# Patient Record
Sex: Male | Born: 1950
Health system: Southern US, Community
[De-identification: ages and names within clinical notes are randomized; demographics above are authoritative.]

## PROBLEM LIST (undated history)

## (undated) ENCOUNTER — Emergency Department (HOSPITAL_COMMUNITY): Admission: EM | Payer: Medicare Other | Source: Home / Self Care

## (undated) DIAGNOSIS — R443 Hallucinations, unspecified: Secondary | ICD-10-CM

## (undated) DIAGNOSIS — F0390 Unspecified dementia without behavioral disturbance: Secondary | ICD-10-CM

---

## 2003-06-13 ENCOUNTER — Emergency Department (HOSPITAL_COMMUNITY): Admission: EM | Admit: 2003-06-13 | Discharge: 2003-06-13 | Payer: Self-pay | Admitting: Emergency Medicine

## 2014-04-18 DIAGNOSIS — R634 Abnormal weight loss: Secondary | ICD-10-CM | POA: Diagnosis not present

## 2014-04-18 DIAGNOSIS — Z125 Encounter for screening for malignant neoplasm of prostate: Secondary | ICD-10-CM | POA: Diagnosis not present

## 2014-04-18 DIAGNOSIS — M549 Dorsalgia, unspecified: Secondary | ICD-10-CM | POA: Diagnosis not present

## 2014-04-18 DIAGNOSIS — R109 Unspecified abdominal pain: Secondary | ICD-10-CM | POA: Diagnosis not present

## 2014-04-18 DIAGNOSIS — R0602 Shortness of breath: Secondary | ICD-10-CM | POA: Diagnosis not present

## 2014-04-29 ENCOUNTER — Other Ambulatory Visit: Payer: Self-pay | Admitting: Internal Medicine

## 2014-04-29 DIAGNOSIS — R1013 Epigastric pain: Secondary | ICD-10-CM

## 2014-04-29 DIAGNOSIS — R0602 Shortness of breath: Secondary | ICD-10-CM | POA: Diagnosis not present

## 2014-04-29 DIAGNOSIS — R634 Abnormal weight loss: Secondary | ICD-10-CM

## 2014-05-03 ENCOUNTER — Ambulatory Visit
Admission: RE | Admit: 2014-05-03 | Discharge: 2014-05-03 | Disposition: A | Discharge status: Home / Self Care | Payer: Medicare Other | Source: Ambulatory Visit | Attending: Internal Medicine | Admitting: Internal Medicine

## 2014-05-03 DIAGNOSIS — R1013 Epigastric pain: Secondary | ICD-10-CM

## 2014-05-03 DIAGNOSIS — R634 Abnormal weight loss: Secondary | ICD-10-CM | POA: Diagnosis not present

## 2014-05-03 DIAGNOSIS — N281 Cyst of kidney, acquired: Secondary | ICD-10-CM | POA: Diagnosis not present

## 2014-05-03 DIAGNOSIS — K921 Melena: Secondary | ICD-10-CM | POA: Diagnosis not present

## 2014-05-03 MED ORDER — IOHEXOL 300 MG/ML  SOLN
125.0000 mL | Freq: Once | INTRAMUSCULAR | Status: AC | PRN
  Administered 2014-05-03: 125 mL via INTRAVENOUS

## 2014-05-06 DIAGNOSIS — R109 Unspecified abdominal pain: Secondary | ICD-10-CM | POA: Diagnosis not present

## 2014-05-06 DIAGNOSIS — R634 Abnormal weight loss: Secondary | ICD-10-CM | POA: Diagnosis not present

## 2014-06-01 DIAGNOSIS — R1031 Right lower quadrant pain: Secondary | ICD-10-CM | POA: Diagnosis not present

## 2014-06-01 DIAGNOSIS — R1032 Left lower quadrant pain: Secondary | ICD-10-CM | POA: Diagnosis not present

## 2014-06-01 DIAGNOSIS — R634 Abnormal weight loss: Secondary | ICD-10-CM | POA: Diagnosis not present

## 2014-06-01 DIAGNOSIS — Z1211 Encounter for screening for malignant neoplasm of colon: Secondary | ICD-10-CM | POA: Diagnosis not present

## 2014-06-14 DIAGNOSIS — R1032 Left lower quadrant pain: Secondary | ICD-10-CM | POA: Diagnosis not present

## 2014-06-14 DIAGNOSIS — Z1211 Encounter for screening for malignant neoplasm of colon: Secondary | ICD-10-CM | POA: Diagnosis not present

## 2014-06-14 DIAGNOSIS — R1031 Right lower quadrant pain: Secondary | ICD-10-CM | POA: Diagnosis not present

## 2014-06-14 DIAGNOSIS — K5730 Diverticulosis of large intestine without perforation or abscess without bleeding: Secondary | ICD-10-CM | POA: Diagnosis not present

## 2014-08-05 DIAGNOSIS — M150 Primary generalized (osteo)arthritis: Secondary | ICD-10-CM | POA: Diagnosis not present

## 2014-08-05 DIAGNOSIS — I10 Essential (primary) hypertension: Secondary | ICD-10-CM | POA: Diagnosis not present

## 2014-08-05 DIAGNOSIS — E6609 Other obesity due to excess calories: Secondary | ICD-10-CM | POA: Diagnosis not present

## 2014-10-10 DIAGNOSIS — I10 Essential (primary) hypertension: Secondary | ICD-10-CM | POA: Diagnosis not present

## 2014-10-13 DIAGNOSIS — I10 Essential (primary) hypertension: Secondary | ICD-10-CM | POA: Diagnosis not present

## 2017-10-20 DIAGNOSIS — M5136 Other intervertebral disc degeneration, lumbar region: Secondary | ICD-10-CM | POA: Diagnosis not present

## 2017-10-20 DIAGNOSIS — M1711 Unilateral primary osteoarthritis, right knee: Secondary | ICD-10-CM | POA: Diagnosis not present

## 2017-10-20 DIAGNOSIS — M25561 Pain in right knee: Secondary | ICD-10-CM | POA: Diagnosis not present

## 2017-10-20 DIAGNOSIS — M545 Low back pain: Secondary | ICD-10-CM | POA: Diagnosis not present

## 2017-10-20 DIAGNOSIS — M25562 Pain in left knee: Secondary | ICD-10-CM | POA: Diagnosis not present

## 2017-10-20 DIAGNOSIS — I10 Essential (primary) hypertension: Secondary | ICD-10-CM | POA: Diagnosis not present

## 2017-11-12 DIAGNOSIS — I10 Essential (primary) hypertension: Secondary | ICD-10-CM | POA: Diagnosis not present

## 2017-11-12 DIAGNOSIS — E6609 Other obesity due to excess calories: Secondary | ICD-10-CM | POA: Diagnosis not present

## 2017-11-12 DIAGNOSIS — Z0000 Encounter for general adult medical examination without abnormal findings: Secondary | ICD-10-CM | POA: Diagnosis not present

## 2017-11-12 DIAGNOSIS — Z131 Encounter for screening for diabetes mellitus: Secondary | ICD-10-CM | POA: Diagnosis not present

## 2017-11-17 DIAGNOSIS — I10 Essential (primary) hypertension: Secondary | ICD-10-CM | POA: Diagnosis not present

## 2017-11-17 DIAGNOSIS — M25561 Pain in right knee: Secondary | ICD-10-CM | POA: Diagnosis not present

## 2017-11-19 DIAGNOSIS — M1711 Unilateral primary osteoarthritis, right knee: Secondary | ICD-10-CM | POA: Diagnosis not present

## 2017-11-19 DIAGNOSIS — M1712 Unilateral primary osteoarthritis, left knee: Secondary | ICD-10-CM | POA: Diagnosis not present

## 2018-12-30 DIAGNOSIS — M549 Dorsalgia, unspecified: Secondary | ICD-10-CM | POA: Diagnosis not present

## 2018-12-30 DIAGNOSIS — Z125 Encounter for screening for malignant neoplasm of prostate: Secondary | ICD-10-CM | POA: Diagnosis not present

## 2018-12-30 DIAGNOSIS — K5900 Constipation, unspecified: Secondary | ICD-10-CM | POA: Diagnosis not present

## 2018-12-30 DIAGNOSIS — E6609 Other obesity due to excess calories: Secondary | ICD-10-CM | POA: Diagnosis not present

## 2018-12-30 DIAGNOSIS — I10 Essential (primary) hypertension: Secondary | ICD-10-CM | POA: Diagnosis not present

## 2019-02-18 ENCOUNTER — Other Ambulatory Visit: Payer: Self-pay

## 2019-05-13 DIAGNOSIS — Z23 Encounter for immunization: Secondary | ICD-10-CM | POA: Diagnosis not present

## 2019-05-13 DIAGNOSIS — Z7189 Other specified counseling: Secondary | ICD-10-CM | POA: Diagnosis not present

## 2019-05-13 DIAGNOSIS — F0390 Unspecified dementia without behavioral disturbance: Secondary | ICD-10-CM | POA: Diagnosis not present

## 2019-05-13 DIAGNOSIS — Z125 Encounter for screening for malignant neoplasm of prostate: Secondary | ICD-10-CM | POA: Diagnosis not present

## 2019-05-13 DIAGNOSIS — Z0000 Encounter for general adult medical examination without abnormal findings: Secondary | ICD-10-CM | POA: Diagnosis not present

## 2019-05-13 DIAGNOSIS — I10 Essential (primary) hypertension: Secondary | ICD-10-CM | POA: Diagnosis not present

## 2019-05-17 DIAGNOSIS — M25561 Pain in right knee: Secondary | ICD-10-CM | POA: Diagnosis not present

## 2019-05-17 DIAGNOSIS — F0390 Unspecified dementia without behavioral disturbance: Secondary | ICD-10-CM | POA: Diagnosis not present

## 2019-05-17 DIAGNOSIS — R972 Elevated prostate specific antigen [PSA]: Secondary | ICD-10-CM | POA: Diagnosis not present

## 2019-05-17 DIAGNOSIS — M545 Low back pain: Secondary | ICD-10-CM | POA: Diagnosis not present

## 2019-05-17 DIAGNOSIS — Z7189 Other specified counseling: Secondary | ICD-10-CM | POA: Diagnosis not present

## 2019-05-17 DIAGNOSIS — E6609 Other obesity due to excess calories: Secondary | ICD-10-CM | POA: Diagnosis not present

## 2019-05-25 ENCOUNTER — Other Ambulatory Visit: Payer: Self-pay | Admitting: Internal Medicine

## 2019-05-25 DIAGNOSIS — M25561 Pain in right knee: Secondary | ICD-10-CM

## 2019-06-01 ENCOUNTER — Ambulatory Visit
Admission: RE | Admit: 2019-06-01 | Discharge: 2019-06-01 | Disposition: A | Discharge status: Home / Self Care | Payer: Medicare Other | Source: Ambulatory Visit | Attending: Internal Medicine | Admitting: Internal Medicine

## 2019-06-01 DIAGNOSIS — M25561 Pain in right knee: Secondary | ICD-10-CM

## 2019-06-01 DIAGNOSIS — R600 Localized edema: Secondary | ICD-10-CM | POA: Diagnosis not present

## 2019-06-01 DIAGNOSIS — M1711 Unilateral primary osteoarthritis, right knee: Secondary | ICD-10-CM | POA: Diagnosis not present

## 2019-06-03 DIAGNOSIS — R972 Elevated prostate specific antigen [PSA]: Secondary | ICD-10-CM | POA: Diagnosis not present

## 2019-06-09 DIAGNOSIS — I10 Essential (primary) hypertension: Secondary | ICD-10-CM | POA: Diagnosis not present

## 2019-06-09 DIAGNOSIS — F0390 Unspecified dementia without behavioral disturbance: Secondary | ICD-10-CM | POA: Diagnosis not present

## 2019-06-09 DIAGNOSIS — R972 Elevated prostate specific antigen [PSA]: Secondary | ICD-10-CM | POA: Diagnosis not present

## 2019-06-09 DIAGNOSIS — M25561 Pain in right knee: Secondary | ICD-10-CM | POA: Diagnosis not present

## 2019-06-16 ENCOUNTER — Other Ambulatory Visit: Payer: Self-pay

## 2019-07-29 DIAGNOSIS — R972 Elevated prostate specific antigen [PSA]: Secondary | ICD-10-CM | POA: Diagnosis not present

## 2020-05-17 DIAGNOSIS — Z23 Encounter for immunization: Secondary | ICD-10-CM | POA: Diagnosis not present

## 2020-05-17 DIAGNOSIS — F0390 Unspecified dementia without behavioral disturbance: Secondary | ICD-10-CM | POA: Diagnosis not present

## 2020-05-17 DIAGNOSIS — M25561 Pain in right knee: Secondary | ICD-10-CM | POA: Diagnosis not present

## 2020-05-17 DIAGNOSIS — R972 Elevated prostate specific antigen [PSA]: Secondary | ICD-10-CM | POA: Diagnosis not present

## 2020-05-17 DIAGNOSIS — Z0000 Encounter for general adult medical examination without abnormal findings: Secondary | ICD-10-CM | POA: Diagnosis not present

## 2020-05-17 DIAGNOSIS — I10 Essential (primary) hypertension: Secondary | ICD-10-CM | POA: Diagnosis not present

## 2020-05-24 DIAGNOSIS — R972 Elevated prostate specific antigen [PSA]: Secondary | ICD-10-CM | POA: Diagnosis not present

## 2020-05-24 DIAGNOSIS — R634 Abnormal weight loss: Secondary | ICD-10-CM | POA: Diagnosis not present

## 2020-05-24 DIAGNOSIS — I10 Essential (primary) hypertension: Secondary | ICD-10-CM | POA: Diagnosis not present

## 2020-05-24 DIAGNOSIS — F0390 Unspecified dementia without behavioral disturbance: Secondary | ICD-10-CM | POA: Diagnosis not present

## 2020-05-24 DIAGNOSIS — E6609 Other obesity due to excess calories: Secondary | ICD-10-CM | POA: Diagnosis not present

## 2021-05-04 DIAGNOSIS — I10 Essential (primary) hypertension: Secondary | ICD-10-CM | POA: Diagnosis not present

## 2021-05-11 DIAGNOSIS — R443 Hallucinations, unspecified: Secondary | ICD-10-CM | POA: Diagnosis not present

## 2021-05-11 DIAGNOSIS — F03C2 Unspecified dementia, severe, with psychotic disturbance: Secondary | ICD-10-CM | POA: Diagnosis not present

## 2021-05-11 DIAGNOSIS — I10 Essential (primary) hypertension: Secondary | ICD-10-CM | POA: Diagnosis not present

## 2021-05-11 DIAGNOSIS — F0390 Unspecified dementia without behavioral disturbance: Secondary | ICD-10-CM | POA: Diagnosis not present

## 2021-05-12 ENCOUNTER — Other Ambulatory Visit: Payer: Self-pay

## 2021-05-12 ENCOUNTER — Emergency Department (HOSPITAL_COMMUNITY)
Admission: EM | Admit: 2021-05-12 | Discharge: 2021-05-15 | Disposition: A | Discharge status: Home / Self Care | Payer: Medicare Other | Attending: Emergency Medicine | Admitting: Emergency Medicine

## 2021-05-12 ENCOUNTER — Emergency Department (HOSPITAL_COMMUNITY): Payer: Medicare Other

## 2021-05-12 ENCOUNTER — Encounter (HOSPITAL_COMMUNITY): Payer: Self-pay | Admitting: Emergency Medicine

## 2021-05-12 DIAGNOSIS — D649 Anemia, unspecified: Secondary | ICD-10-CM | POA: Diagnosis not present

## 2021-05-12 DIAGNOSIS — R451 Restlessness and agitation: Secondary | ICD-10-CM | POA: Diagnosis not present

## 2021-05-12 DIAGNOSIS — R443 Hallucinations, unspecified: Secondary | ICD-10-CM | POA: Diagnosis not present

## 2021-05-12 DIAGNOSIS — F03911 Unspecified dementia, unspecified severity, with agitation: Secondary | ICD-10-CM | POA: Insufficient documentation

## 2021-05-12 DIAGNOSIS — U071 COVID-19: Secondary | ICD-10-CM | POA: Diagnosis not present

## 2021-05-12 DIAGNOSIS — I517 Cardiomegaly: Secondary | ICD-10-CM | POA: Diagnosis not present

## 2021-05-12 DIAGNOSIS — Z79899 Other long term (current) drug therapy: Secondary | ICD-10-CM | POA: Insufficient documentation

## 2021-05-12 DIAGNOSIS — F03C11 Unspecified dementia, severe, with agitation: Secondary | ICD-10-CM | POA: Insufficient documentation

## 2021-05-12 DIAGNOSIS — R4182 Altered mental status, unspecified: Secondary | ICD-10-CM | POA: Diagnosis not present

## 2021-05-12 DIAGNOSIS — D696 Thrombocytopenia, unspecified: Secondary | ICD-10-CM | POA: Diagnosis not present

## 2021-05-12 DIAGNOSIS — D72819 Decreased white blood cell count, unspecified: Secondary | ICD-10-CM | POA: Insufficient documentation

## 2021-05-12 DIAGNOSIS — R9431 Abnormal electrocardiogram [ECG] [EKG]: Secondary | ICD-10-CM | POA: Diagnosis not present

## 2021-05-12 LAB — URINALYSIS, ROUTINE W REFLEX MICROSCOPIC
Bacteria, UA: NONE SEEN
Bilirubin Urine: NEGATIVE
Glucose, UA: NEGATIVE mg/dL
Ketones, ur: 5 mg/dL — AB
Leukocytes,Ua: NEGATIVE
Nitrite: NEGATIVE
Protein, ur: 30 mg/dL — AB
Specific Gravity, Urine: 1.025 (ref 1.005–1.030)
pH: 5 (ref 5.0–8.0)

## 2021-05-12 LAB — COMPREHENSIVE METABOLIC PANEL
ALT: 19 U/L (ref 0–44)
AST: 31 U/L (ref 15–41)
Albumin: 4.5 g/dL (ref 3.5–5.0)
Alkaline Phosphatase: 32 U/L — ABNORMAL LOW (ref 38–126)
Anion gap: 8 (ref 5–15)
BUN: 13 mg/dL (ref 8–23)
CO2: 25 mmol/L (ref 22–32)
Calcium: 8.7 mg/dL — ABNORMAL LOW (ref 8.9–10.3)
Chloride: 103 mmol/L (ref 98–111)
Creatinine, Ser: 1.19 mg/dL (ref 0.61–1.24)
GFR, Estimated: >60 mL/min (ref >60)
Glucose, Bld: 99 mg/dL (ref 70–99)
Potassium: 3.8 mmol/L (ref 3.5–5.1)
Sodium: 136 mmol/L (ref 135–145)
Total Bilirubin: 1 mg/dL (ref 0.3–1.2)
Total Protein: 7.6 g/dL (ref 6.5–8.1)

## 2021-05-12 LAB — CBC WITH DIFFERENTIAL/PLATELET
Abs Immature Granulocytes: 0.01 10*3/uL (ref 0.00–0.07)
Basophils Absolute: 0 10*3/uL (ref 0.0–0.1)
Basophils Relative: 1 %
Eosinophils Absolute: 0 10*3/uL (ref 0.0–0.5)
Eosinophils Relative: 1 %
HCT: 32.7 % — ABNORMAL LOW (ref 39.0–52.0)
Hemoglobin: 11.3 g/dL — ABNORMAL LOW (ref 13.0–17.0)
Immature Granulocytes: 1 %
Lymphocytes Relative: 34 %
Lymphs Abs: 0.6 10*3/uL — ABNORMAL LOW (ref 0.7–4.0)
MCH: 34.1 pg — ABNORMAL HIGH (ref 26.0–34.0)
MCHC: 34.6 g/dL (ref 30.0–36.0)
MCV: 98.8 fL (ref 80.0–100.0)
Monocytes Absolute: 0.3 10*3/uL (ref 0.1–1.0)
Monocytes Relative: 16 %
Neutro Abs: 0.9 10*3/uL — ABNORMAL LOW (ref 1.7–7.7)
Neutrophils Relative %: 47 %
Platelets: 137 10*3/uL — ABNORMAL LOW (ref 150–400)
RBC: 3.31 MIL/uL — ABNORMAL LOW (ref 4.22–5.81)
RDW: 12 % (ref 11.5–15.5)
WBC: 1.8 10*3/uL — ABNORMAL LOW (ref 4.0–10.5)
nRBC: 0 % (ref 0.0–0.2)

## 2021-05-12 LAB — SALICYLATE LEVEL: Salicylate Lvl: <7 mg/dL — ABNORMAL LOW (ref 7.0–30.0)

## 2021-05-12 LAB — CARBAMAZEPINE LEVEL, TOTAL: Carbamazepine Lvl: 5.2 ug/mL (ref 4.0–12.0)

## 2021-05-12 LAB — ETHANOL: Alcohol, Ethyl (B): <10 mg/dL (ref <10)

## 2021-05-12 LAB — RESP PANEL BY RT-PCR (FLU A&B, COVID) ARPGX2
Influenza A by PCR: NEGATIVE
Influenza B by PCR: NEGATIVE
SARS Coronavirus 2 by RT PCR: POSITIVE — AB

## 2021-05-12 LAB — ACETAMINOPHEN LEVEL: Acetaminophen (Tylenol), Serum: <10 ug/mL — ABNORMAL LOW (ref 10–30)

## 2021-05-12 MED ORDER — MEMANTINE HCL 5 MG PO TABS
10.0000 mg | ORAL_TABLET | Freq: Two times a day (BID) | ORAL | Status: DC
  Administered 2021-05-14 – 2021-05-15 (×3): 10 mg via ORAL
  Filled 2021-05-12 (×3): qty 2

## 2021-05-12 MED ORDER — LORAZEPAM 2 MG/ML IJ SOLN
1.0000 mg | Freq: Once | INTRAMUSCULAR | Status: AC
  Administered 2021-05-12: 1 mg via INTRAMUSCULAR
  Filled 2021-05-12: qty 1

## 2021-05-12 MED ORDER — LORAZEPAM 1 MG PO TABS
2.0000 mg | ORAL_TABLET | Freq: Once | ORAL | Status: AC
  Administered 2021-05-12: 2 mg via ORAL
  Filled 2021-05-12: qty 2

## 2021-05-12 NOTE — ED Triage Notes (Signed)
Pt arrived via PD escort, pr pt SO, pt early stages of dementia. Has been hallucinating, seeing people in home, chasing them with small bat.

## 2021-05-12 NOTE — ED Provider Notes (Addendum)
Walter [name]   CSN: [id number] Arrival date & time: 05/12/21  0945     History Chief Complaint  Patient presents with   Hallucinations    Walter [name] is a 70 y.o. male with past medical history significant for recent worsening dementia who was started on carbamazepine as well as lorazepam recently at his last appointment to assess his worsening dementia who presents accompanied by wife and daughter due to 1 night of worsening confusion, insomnia, dangerous behavior.  Family reports that patient was up all night, in the kitchen, turning water on, running outside, walking around the house with a baseball bat, sharp screwdriver and intent to "keep the house safe from intruders".  Patient has not had any recent falls or injuries.  Family believes that these problems have worsened since starting his new medications, prior to beginning the carbamazepine and lorazepam patient's primary symptom was some sundowning.  Family also reports that he has had some significant hallucinations including thinking that people are breaking into the house, hearing voices that are not there.  Patient is on memantine at baseline, dose unchanged at last visit.  Patient's wife reports that he did take his dose of memantine this morning, but usually takes it twice daily, patient has not had any Ativan or carbamazepine today.  Level 5 caveat: dementia  HPI     History reviewed. No pertinent past medical history.  There are no problems to display for this patient.   History reviewed. No pertinent surgical history.     History reviewed. No pertinent family history.     Home Medications Prior to Admission medications   Medication Sig Start Date End Date Taking? Authorizing Provider  amLODipine (NORVASC) 5 MG tablet Take 5 mg by mouth daily. 11/17/17  Yes [provider]  carbamazepine (TEGRETOL) 200 MG tablet Take 200 mg by mouth in the morning and  at bedtime. 05/11/21  Yes [provider]  irbesartan-hydrochlorothiazide (AVALIDE) 150-12.5 MG tablet Take 1 tablet by mouth daily. 05/24/20  Yes [provider]  LORazepam (ATIVAN) 1 MG tablet Take 1 mg by mouth in the morning and at bedtime. 05/11/21  Yes [provider]  memantine (NAMENDA) 10 MG tablet Take 10 mg by mouth in the morning and at bedtime.   Yes [provider]    Allergies    Patient has no known allergies.  Review of Systems   Review of Systems  Psychiatric/Behavioral:  Positive for agitation, confusion and sleep disturbance. The patient is nervous/anxious.   All other systems reviewed and are negative.  Physical Exam Updated Vital Signs BP (!) 152/90   Pulse (!) 53   Temp 98.4 F (36.9 C) (Oral)   Resp 18   SpO2 100%   Physical Exam Vitals and nursing [name] reviewed.  Constitutional:      General: He is not in acute distress.    Appearance: Normal appearance.  HENT:     Head: Normocephalic and atraumatic.  Eyes:     General:        Right eye: No discharge.        Left eye: No discharge.  Cardiovascular:     Rate and Rhythm: Normal rate and regular rhythm.     Heart sounds: No murmur heard.   No friction rub. No gallop.  Pulmonary:     Effort: Pulmonary effort is normal.     Breath sounds: Normal breath sounds.  Abdominal:     General: Bowel  sounds are normal.     Palpations: Abdomen is soft.  Skin:    General: Skin is warm and dry.     Capillary Refill: Capillary refill takes less than 2 seconds.  Neurological:     Mental Status: He is alert.     Comments: Oriented only to self.  Cranial nerves III through XII grossly intact.  Romberg negative, gait normal.  Thought content is confused, difficult to keep patient on 1 track.  Illogical thought content.  Psychiatric:     Comments: Patient denies suicidal ideations, homicidal ideations, however he does endorse that he has been seeing or hearing people that his wife  is not seeing, occasionally hears people speaking that his wife does not hear.    ED Results / Procedures / Treatments   Labs (all labs ordered are listed, but only abnormal results are displayed) Labs Reviewed  URINALYSIS, ROUTINE W REFLEX MICROSCOPIC - Abnormal; Notable for the following components:      Result Value   Hgb urine dipstick SMALL (*)    Ketones, ur 5 (*)    Protein, ur 30 (*)    All other components within normal limits  COMPREHENSIVE METABOLIC PANEL - Abnormal; Notable for the following components:   Calcium 8.7 (*)    Alkaline Phosphatase 32 (*)    All other components within normal limits  CBC WITH DIFFERENTIAL/PLATELET - Abnormal; Notable for the following components:   WBC 1.8 (*)    RBC 3.31 (*)    Hemoglobin 11.3 (*)    HCT 32.7 (*)    MCH 34.1 (*)    Platelets 137 (*)    Neutro Abs 0.9 (*)    Lymphs Abs 0.6 (*)    All other components within normal limits  SALICYLATE LEVEL - Abnormal; Notable for the following components:   Salicylate Lvl <7.0 (*)    All other components within normal limits  ACETAMINOPHEN LEVEL - Abnormal; Notable for the following components:   Acetaminophen (Tylenol), Serum <10 (*)    All other components within normal limits  RESP PANEL BY RT-PCR (FLU A&B, COVID) ARPGX2  ETHANOL  CARBAMAZEPINE LEVEL, TOTAL    EKG EKG Interpretation  Date/Time:  Saturday May 12 2021 10:50:34 EDT Ventricular Rate:  62 PR Interval:  150 QRS Duration: 90 QT Interval:  400 QTC Calculation: 407 R Axis:   37 Text Interpretation: Sinus rhythm No old tracing to compare Confirmed by Mancel Bale [id number] on 05/12/2021 11:57:01 AM  Radiology CT Head Wo Contrast  Result Date: 05/12/2021 CLINICAL DATA:  Mental status change.  Hallucinations. EXAM: CT HEAD WITHOUT CONTRAST TECHNIQUE: Contiguous axial images were obtained from the base of the skull through the vertex without intravenous contrast. COMPARISON:  None. FINDINGS: Brain: No evidence  of acute infarction, hemorrhage, hydrocephalus, extra-axial collection or mass lesion/mass effect. There is mild diffuse low-attenuation within the subcortical and periventricular white matter compatible with chronic microvascular disease. Vascular: No hyperdense vessel or unexpected calcification. Skull: Normal. Negative for fracture or focal lesion. Sinuses/Orbits: Chronic periosteal thickening involving the right maxillary sinus noted. There is mild mucosal thickening involving the left maxillary sinus. Other: None IMPRESSION: 1. No acute intracranial abnormalities. 2. Chronic small vessel ischemic change. Electronically Signed   By: Signa Kell M.D.   On: 05/12/2021 10:49    Procedures Procedures   Medications Ordered in ED Medications  LORazepam (ATIVAN) tablet 2 mg (has no administration in time range)    ED Course  I have reviewed the triage vital  signs and the nursing notes.  Pertinent labs & imaging results that were available during my care of the patient were reviewed by me and considered in my medical decision making (see chart for details).    MDM Rules/Calculators/A&P                         I discussed this case with my attending physician who cosigned this [name] including patient's presenting symptoms, physical exam, and planned diagnostics and interventions. Attending physician stated agreement with plan or made changes to plan which were implemented.   Attending physician assessed patient at bedside.  Patient is alert and oriented only to himself during my examination, however has no other neurologic deficits.  CT head does not reveal any other intracranial abnormalities.  Lab work reveals mild anemia, mild leukopenia, mild thrombocytopenia.  Without any other evidence of the symptoms, or reason to suspect bone marrow crisis or aplastic anemia, believe this may be a side effect of patient's memantine medication.  Patient's symptoms worsen, now include aggressive behaviors,  with both his wife, and his daughter worried for their own and the patient safety, we will consult psych at this time in order to further assess patient's symptoms, consideration for admission at this time.  Patient denies SI, HI, does endorse audiovisual hallucinations at this time.  Physical exam otherwise unremarkable, carbamazepine level pending at this time no other signs of toxic ingestion. Patient is medically cleared at this time.  Based on new aggressive uncontrolled behaviors with dementia will consult TTS for geriatric psychiatry consult at this time.  Patient is due for memantine later tonight, may receive Ativan as needed for agitation.  3:12 PM Patient wanted to leave, wife still insistent that patient needs to be evaluated and patient given ativan at this time. Patient to be IVCd due to danger to others if he attempts to leave. Final Clinical Impression(s) / ED Diagnoses Final diagnoses:  Hallucinations  Severe dementia with agitation, unspecified dementia type    Rx / DC Orders ED Discharge Orders     None        Olene Floss, PA-C 05/12/21 1513    Olene Floss, PA-C 05/12/21 1531    Mancel Bale, MD 05/13/21 [phone]

## 2021-05-12 NOTE — ED Provider Notes (Signed)
  Face-to-face evaluation   History: He presents at the insistence of family members for evaluation of suspected dementia.  Saw his PCP yesterday who ordered Ativan carbamazepine.  At home he has been hallucinating, carrying a baseball bat in a threatening manner.  He is here with his wife who states that he became acutely worse last night but has been calm progressively worse for about 2 weeks.  He has been on memantine, for memory loss, for 6 months.  Physical exam: Elderly, alert and cooperative.  No acute distress noted.  He is unable to give history.  He is pleasant and follows directions.  His appearance is nontoxic.  Medical screening examination/treatment/procedure(s) were conducted as a shared visit with non-physician practitioner(s) and myself.  I personally evaluated the patient during the encounter    Mancel Bale, MD 05/13/21 [phone]

## 2021-05-12 NOTE — ED Notes (Signed)
Pt attempting to leave and pt pulled code blue button off the wall. Pt getting harder to redirect back to bed. Pt under IVC at this time. PA Logan made aware.

## 2021-05-12 NOTE — ED Provider Notes (Addendum)
PossiblePatient's COVID test is positive.  It may explain some of his worsening dementia.  Will reassess to see if there is an indication for medical admission.  We will based on his labs on his white blood cell count being 1.8 and absolute neutrophil count of 0.9.  We will discussed with hospitalist about this.  But patient certainly had pre-existing dementia.  And could be that the COVID illness is just making things worse.  Urine negative.  Head CT without any acute findings.  Will get chest x-ray since he is COVID-positive.   Fredia Sorrow, MD 05/12/21 2026  Patient's chest x-ray without any acute findings.  Discussed the low white blood cell count and the absolute neutrophil count with the on-call hours.  Did not feel that he met any criteria for medical admission regarding but did state that it would be worthwhile to follow them carefully.  Patient's not febrile.  Is not hypoxic.  Chest x-ray is clear.  They also did not feel he needed any kind of COVID intervention medications at this time.  Know there is a delay with admission when they are COVID-positive through the geriatric process.  But we will continue to follow him closely medically.  But at this point in time not meeting indications for medical admission.    Fredia Sorrow, MD 05/12/21 2107

## 2021-05-12 NOTE — BH Assessment (Signed)
Comprehensive Clinical Assessment (CCA) Note  05/12/2021 Walter [name] [id number]  DISPOSITION: Leevy-Johnson NP recommends a inpatient admission (Geropsychiatry) to assist with stabilization as bed placement is investigated.   Flowsheet Row ED from 05/12/2021 in [facility]ERGENCY DEPT  C-SSRS RISK CATEGORY No Risk        Flowsheet Row ED from 05/12/2021 in [facility]GENCY DEPT  C-SSRS RISK CATEGORY No Risk      The patient demonstrates the following risk factors for suicide: Chronic risk factors for suicide include: N/A. Acute risk factors for suicide include: N/A. Protective factors for this patient include: coping skills. Considering these factors, the overall suicide risk at this point appears to be low. Patient is not appropriate for outpatient follow up.   Patient is a 70 year old male that presents with AMS after patient per wife Walter [name] [phone]) reports that patient has had increased hallucinations over the last week with VH growing increasingly worse over the last 48 hours. Patient's wife reports patient in the last 24 hours has been violent "trying to attack people that are not there." Wife reports that patient was diagnosed with dementia in June of 2021. Wife states patient has never been admitted inpatient for any memory care issues although has been on medications from his PCP since then. Wife reports current compliance and recent medication changes within the last week with patient being prescribed Ativan and Tegretol. See MAR. Patient denies any S/I, H/I or AVH although this writer is uncertain if patient is processing the content of this writer's questions. Patient just keeps repeating "no, no, no" when this writer asks any assessment questions. Patient is not oriented and is very disorganized. Patient is observed to be urinating on himself and looking under the bed for "dead bodies." Wife reports patient recently took a  screwdriver (all sharps have been removed from home) and was trying to "attack the intruders in their home." Wife reports patient has never attempted self harm or tried to harm others on purpose. Wife is requesting a inpatient admission to assist with stabilization.            Prosperi PA writes earlier this date: Walter [name] is a 70 y.o. male with past medical history significant for recent worsening dementia who was started on carbamazepine as well as lorazepam recently at his last appointment to assess his worsening dementia who presents accompanied by wife and daughter due to 1 night of worsening confusion, insomnia, dangerous behavior.  Family reports that patient was up all night, in the kitchen, turning water on, running outside, walking around the house with a baseball bat, sharp screwdriver and intent to "keep the house safe from intruders".  Patient has not had any recent falls or injuries.  Family believes that these problems have worsened since starting his new medications, prior to beginning the carbamazepine and lorazepam patient's primary symptom was some sundowning.  Family also reports that he has had some significant hallucinations including thinking that people are breaking into the house, hearing voices that are not there.  Patient is on memantine at baseline, dose unchanged at last visit.  Patient's wife reports that he did take his dose of memantine this morning, but usually takes it twice daily, patient has not had any Ativan or carbamazepine today.  Patient is not oriented. Patient is very disorganized with memory impaired and thoughts disorganized. Patient's mood is anxious with affect congruent. Patient appears to be responding to internal stimuli as evidenced by patient "  looking under his bed for dead people."       Chief Complaint:  Chief Complaint  Patient presents with   Hallucinations   Visit Diagnosis: Dementia     CCA Screening, Triage and Referral  (STR)  Patient Reported Information How did you hear about [place]? Family/Friend  What Is the Reason for Your Visit/Call Today? Ongoing memory care issues  How Long Has This Been Causing You Problems? 1-6 months  What Do You Feel Would Help You the Most Today? -- (UTA)   Have You Recently Had Any Thoughts About Hurting Yourself? No  Are You Planning to Commit Suicide/Harm Yourself At This time? No   Have you Recently Had Thoughts About Hurting Someone Walter [name]? No  Are You Planning to Harm Someone at This Time? No  Explanation: No data recorded  Have You Used Any Alcohol or Drugs in the Past 24 Hours? No  How Long Ago Did You Use Drugs or Alcohol? No data recorded What Did You Use and How Much? No data recorded  Do You Currently Have a Therapist/Psychiatrist? No  Name of Therapist/Psychiatrist: No data recorded  Have You Been Recently Discharged From Any Office Practice or Programs? No  Explanation of Discharge From Practice/Program: No data recorded    CCA Screening Triage Referral Assessment Type of Contact: Face-to-Face  Telemedicine Service Delivery:   Is this Initial or Reassessment? No data recorded Date Telepsych consult ordered in [facility]:  No data recorded Time Telepsych consult ordered in [facility]:  No data recorded Location of Assessment: [facility] ED  Provider Location: [facility]   Collateral Involvement: Wife who is present at bedside   Does Patient Have a Court Appointed Legal Guardian? No data recorded Name and Contact of Legal Guardian: No data recorded If Minor and Not Living with Parent(s), Who has Custody? NA  Is CPS involved or ever been involved? Never  Is APS involved or ever been involved? Never   Patient Determined To Be At Risk for Harm To Self or Others Based on Review of Patient Reported Information or Presenting Complaint? No  Method: No data recorded Availability of Means: No data recorded Intent: No data recorded Notification  Required: No data recorded Additional Information for Danger to Others Potential: No data recorded Additional Comments for Danger to Others Potential: No data recorded Are There Guns or Other Weapons in Your Home? No data recorded Types of Guns/Weapons: No data recorded Are These Weapons Safely Secured?                            No data recorded Who Could Verify You Are Able To Have These Secured: No data recorded Do You Have any Outstanding Charges, Pending Court Dates, Parole/Probation? No data recorded Contacted To Inform of Risk of Harm To Self or Others: Other: Comment (NA)    Does Patient Present under Involuntary Commitment? No  IVC Papers Initial File Date: No data recorded  [place] of Residence: [place]   Patient Currently Receiving the Following Services: Medication Management   Determination of Need: Emergent (2 hours)   Options For Referral: Medication Management     CCA Biopsychosocial Patient Reported Schizophrenia/Schizoaffective Diagnosis in Past: No   Strengths: UTA   Mental Health Symptoms Depression:   -- (UTA)   Duration of Depressive symptoms:    Mania:   -- (UTA)   Anxiety:    -- (UTA)   Psychosis:   Hallucinations   Duration of  Psychotic symptoms:  Duration of Psychotic Symptoms: Less than six months   Trauma:   -- (UTA)   Obsessions:   None   Compulsions:   None   Inattention:   None   Hyperactivity/Impulsivity:   None   Oppositional/Defiant Behaviors:   None   Emotional Irregularity:   -- (UTA)   Other Mood/Personality Symptoms:   UTA    Mental Status Exam Appearance and self-care  Stature:   Average   Weight:   Average weight   Clothing:   Disheveled   Grooming:   Neglected   Cosmetic use:   None   Posture/gait:   Bizarre   Motor activity:   Agitated   Sensorium  Attention:   Distractible   Concentration:   Preoccupied   Orientation:   -- (UTA)   Recall/memory:   Defective in  Recent; Defective in Immediate; Defective in Remote; Defective in Short-term   Affect and Mood  Affect:   Anxious   Mood:   Anxious   Relating  Eye contact:   None   Facial expression:   Constricted   Attitude toward examiner:   Uninterested   Thought and Language  Speech flow:  Pressured   Thought content:   Suspicious   Preoccupation:   None   Hallucinations:   Visual   Organization:  No data recorded  [employer] of Knowledge:   -- Industrial/product designer)   Intelligence:   -- (UTA)   Abstraction:   -- (UTA)   Judgement:   -- (UTA)   Reality Testing:   Unaware   Insight:   Unaware   Decision Making:   Confused   Social Functioning  Social Maturity:   -- Industrial/product designer)   Social Judgement:   -- Industrial/product designer)   Stress  Stressors:   -- (UTA)   Coping Ability:   -- Industrial/product designer)   Skill Deficits:   Activities of daily living   Supports:   Family     Religion: Religion/Spirituality Are You A Religious Person?:  (UTA) How Might This Affect Treatment?: UTA  Leisure/Recreation:    Exercise/Diet:     CCA Employment/Education Employment/Work Situation: Employment / Work Systems developer: Retired  Programme researcher, broadcasting/film/video: Education Did You Have An Engineer, manufacturing (IIEP): No Did You Have Any Difficulty At [employer]?: No Patient's Education Has Been Impacted by Current Illness: No   CCA Family/Childhood History Family and Relationship History: Family history Marital status: Married What types of issues is patient dealing with in the relationship?: UTA Additional relationship information: UTA Does patient have children?: Yes How many children?: 1 How is patient's relationship with their children?: UTA  Childhood History:  Childhood History By whom was/is the patient raised?:  (UTA) Did patient suffer any verbal/emotional/physical/sexual abuse as a child?:  (UTA) Did patient suffer from severe childhood neglect?:  (UTA) Has  patient ever been sexually abused/assaulted/raped as an adolescent or adult?:  (UTA) Was the patient ever a victim of a crime or a disaster?:  (UTA) Witnessed domestic violence?:  (UTA) Has patient been affected by domestic violence as an adult?:  Industrial/product designer)  Child/Adolescent Assessment:     CCA Substance Use Alcohol/Drug Use: Alcohol / Drug Use Pain Medications: See MAR Prescriptions: See MAR Over the Counter: See MAR History of alcohol / drug use?:  (UTA)                         ASAM's:  Six Dimensions of Multidimensional Assessment  Dimension 1:  Acute Intoxication and/or Withdrawal Potential:      Dimension 2:  Biomedical Conditions and Complications:      Dimension 3:  Emotional, Behavioral, or Cognitive Conditions and Complications:     Dimension 4:  Readiness to Change:     Dimension 5:  Relapse, Continued use, or Continued Problem Potential:     Dimension 6:  Recovery/Living Environment:     ASAM Severity Score:    ASAM Recommended Level of Treatment:     Substance use Disorder (SUD)    Recommendations for Services/Supports/Treatments:    Discharge Disposition:    DSM5 Diagnoses: There are no problems to display for this patient.    Referrals to Alternative Service(s): Referred to Alternative Service(s):   Place:   Date:   Time:    Referred to Alternative Service(s):   Place:   Date:   Time:    Referred to Alternative Service(s):   Place:   Date:   Time:    Referred to Alternative Service(s):   Place:   Date:   Time:     Alfredia Ferguson, LCAS

## 2021-05-12 NOTE — ED Notes (Signed)
Pt walking out of room and stating he wants to leave. RN informed pt he could not leave. Pt able to be redirected backed to bed at this time. PA Chrisitan made aware.

## 2021-05-12 NOTE — ED Provider Notes (Signed)
Emergency Medicine Provider Triage Evaluation Note  Walter [name] , a 70 y.o. male  was evaluated in triage.  Pt complains of hallucinations.  Patient is accompanied by wife at bedside who provides most of the history.  Was diagnosed with dementia in June 2021.  States that he was recently prescribed new medications including carbamazepine and lorazepam on top of his daily memantine.  Wife states that he has been hallucinating that there are people in the house chasing him.  He was noted to be "chasing people out of the house" last night with a baseball bat and then a screwdriver.  Wife found him outside 4 times last night.  States that he did not sleep at all last night.  Difficult to obtain history from patient.  He is oriented to self only.  He endorses hearing voices in his head and there being a lot of people in the house.  Appears somewhat paranoid but cooperative on exam.  Review of Systems  Positive: Hallucinations Negative: Fever, diarrhea or vomiting, complaints of headache, chest pain, abdominal pain, urinary symptoms  Physical Exam  BP (!) 151/98 (BP Location: Left Arm)   Pulse 74   Temp 98.4 F (36.9 C) (Oral)   Resp 16   SpO2 100%  Gen:   Awake, no distress   Cardiac: Loud, harsh systolic murmur on auscultation.  Pulses 2+ bilaterally Resp:  Normal effort, bilateral lungs clear to auscultation MSK:   Moves extremities without difficulty  Other:  A&Ox1 (self), no focal neurological deficits on exam.  No nystagmus.  Strength equal in upper and lower extremities.  Appears somewhat paranoid that there are people in his house.  Positive AVH.  Denies suicidal ideations.  However states he will hurt somebody if they are trying to hurt him in his house.   Medical Decision Making  Medically screening exam initiated at 10:06 AM.  Appropriate orders placed.  Juluis Mire was informed that the remainder of the evaluation will be completed by another provider, this initial triage  assessment does not replace that evaluation, and the importance of remaining in the ED until their evaluation is complete.     Cristopher Peru, PA-C 05/12/21 1010    Mancel Bale, MD 05/13/21 [phone]

## 2021-05-12 NOTE — ED Notes (Signed)
Pt out of bed attempting to pull soap dispenser off wall. Pt becoming drowsy and gait becoming unsteady due to previously administered medications. Pt able to be safely redirected back to bed.

## 2021-05-13 DIAGNOSIS — R451 Restlessness and agitation: Secondary | ICD-10-CM | POA: Diagnosis not present

## 2021-05-13 DIAGNOSIS — F03911 Unspecified dementia, unspecified severity, with agitation: Secondary | ICD-10-CM | POA: Diagnosis not present

## 2021-05-13 DIAGNOSIS — U071 COVID-19: Secondary | ICD-10-CM | POA: Diagnosis not present

## 2021-05-13 MED ORDER — LORAZEPAM 1 MG PO TABS
2.0000 mg | ORAL_TABLET | Freq: Four times a day (QID) | ORAL | Status: DC | PRN
  Administered 2021-05-13 – 2021-05-14 (×2): 2 mg via ORAL
  Filled 2021-05-13 (×2): qty 2

## 2021-05-13 MED ORDER — LORAZEPAM 2 MG/ML IJ SOLN
2.0000 mg | Freq: Once | INTRAMUSCULAR | Status: AC
  Administered 2021-05-13: 2 mg via INTRAMUSCULAR
  Filled 2021-05-13: qty 1

## 2021-05-13 MED ORDER — LORAZEPAM 1 MG PO TABS: 2.0000 mg | ORAL_TABLET | Freq: Once | ORAL | Status: DC

## 2021-05-13 NOTE — ED Provider Notes (Addendum)
Patient is uncooperative and threatening to staff, had responded well to lorazepam earlier.  He will be given a dose of lorazepam and observed.   Dione Booze, MD 05/13/21 0106  Following above-noted treatment, patient is resting comfortably.  We will add as needed oral lorazepam to his medication list.  CRITICAL CARE Performed by: Dione Booze Total critical care time: 35 minutes Critical care time was exclusive of separately billable procedures and treating other patients. Critical care was necessary to treat or prevent imminent or life-threatening deterioration. Critical care was time spent personally by me on the following activities: development of treatment plan with patient and/or surrogate as well as nursing, discussions with consultants, evaluation of patient's response to treatment, examination of patient, obtaining history from patient or surrogate, ordering and performing treatments and interventions, ordering and review of laboratory studies, ordering and review of radiographic studies, pulse oximetry and re-evaluation of patient's condition.   Dione Booze, MD 05/13/21 [phone]

## 2021-05-13 NOTE — ED Notes (Signed)
Pt sitting on side of stretcher eating his dinner.

## 2021-05-13 NOTE — ED Notes (Signed)
Pt woke up and ambulated to the restroom. Upon returning to the room, pt was uncooperative and would not return to bed. Security called to bedside. Pt became agitated and was unable to be redirected. MD Preston Fleeting made aware.

## 2021-05-14 DIAGNOSIS — R451 Restlessness and agitation: Secondary | ICD-10-CM | POA: Diagnosis not present

## 2021-05-14 DIAGNOSIS — F03911 Unspecified dementia, unspecified severity, with agitation: Secondary | ICD-10-CM | POA: Diagnosis not present

## 2021-05-14 DIAGNOSIS — U071 COVID-19: Secondary | ICD-10-CM | POA: Diagnosis not present

## 2021-05-14 LAB — CBC WITH DIFFERENTIAL/PLATELET
Abs Immature Granulocytes: 0.01 10*3/uL (ref 0.00–0.07)
Basophils Absolute: 0 10*3/uL (ref 0.0–0.1)
Basophils Relative: 1 %
Eosinophils Absolute: 0 10*3/uL (ref 0.0–0.5)
Eosinophils Relative: 0 %
HCT: 38.4 % — ABNORMAL LOW (ref 39.0–52.0)
Hemoglobin: 13.3 g/dL (ref 13.0–17.0)
Immature Granulocytes: 0 %
Lymphocytes Relative: 24 %
Lymphs Abs: 0.7 10*3/uL (ref 0.7–4.0)
MCH: 33.7 pg (ref 26.0–34.0)
MCHC: 34.6 g/dL (ref 30.0–36.0)
MCV: 97.2 fL (ref 80.0–100.0)
Monocytes Absolute: 0.4 10*3/uL (ref 0.1–1.0)
Monocytes Relative: 13 %
Neutro Abs: 1.9 10*3/uL (ref 1.7–7.7)
Neutrophils Relative %: 62 %
Platelets: 167 10*3/uL (ref 150–400)
RBC: 3.95 MIL/uL — ABNORMAL LOW (ref 4.22–5.81)
RDW: 11.9 % (ref 11.5–15.5)
WBC: 3 10*3/uL — ABNORMAL LOW (ref 4.0–10.5)
nRBC: 0 % (ref 0.0–0.2)

## 2021-05-14 MED ORDER — LORAZEPAM 0.5 MG PO TABS
0.5000 mg | ORAL_TABLET | Freq: Four times a day (QID) | ORAL | Status: DC | PRN
  Administered 2021-05-15: 0.5 mg via ORAL
  Filled 2021-05-14 (×3): qty 1

## 2021-05-14 MED ORDER — QUETIAPINE FUMARATE 50 MG PO TABS: 25.0000 mg | ORAL_TABLET | Freq: Two times a day (BID) | ORAL | Status: DC

## 2021-05-14 MED ORDER — HALOPERIDOL 1 MG PO TABS
1.0000 mg | ORAL_TABLET | Freq: Two times a day (BID) | ORAL | Status: DC
  Administered 2021-05-14 (×2): 1 mg via ORAL
  Filled 2021-05-14 (×2): qty 1

## 2021-05-14 MED ORDER — HALOPERIDOL LACTATE 5 MG/ML IJ SOLN
4.0000 mg | Freq: Once | INTRAMUSCULAR | Status: AC
  Administered 2021-05-15: 4 mg via INTRAVENOUS
  Filled 2021-05-14: qty 1

## 2021-05-14 NOTE — ED Notes (Signed)
Pt sitting in chair in room. Calm and cooperative.

## 2021-05-14 NOTE — ED Notes (Signed)
PT has removed his pants and has a cover on him.Pt unwilling to put pants back on at this time but is keeping himself covered for now.  Will continue to monitor.

## 2021-05-14 NOTE — ED Notes (Signed)
PT becoming more agitated. Medication offered and accepted by patient.

## 2021-05-14 NOTE — ED Provider Notes (Signed)
Emergency Medicine Observation Re-evaluation Note  Walter [name] is a 70 y.o. male, seen on rounds today.  Pt initially presented to the ED for complaints of Hallucinations Currently, the patient is awaiting TTSrec.  Physical Exam  BP (!) 152/91   Pulse 81   Temp 97.8 F (36.6 C) (Oral)   Resp 20   Ht 6\' 4"  (1.93 m)   SpO2 100%  Physical Exam General: awake Cardiac: regular Lungs: clear3 Psych: calm  ED Course / MDM  EKG:EKG Interpretation  Date/Time:  Saturday May 12 2021 10:50:34 EDT Ventricular Rate:  62 PR Interval:  150 QRS Duration: 90 QT Interval:  400 QTC Calculation: 407 R Axis:   37 Text Interpretation: Sinus rhythm No old tracing to compare Confirmed by [id number] [phone]) on 05/12/2021 11:57:01 AM  I have reviewed the labs performed to date as well as medications administered while in observation.  Recent changes in the last 24 hours include nothing.  Plan  Current plan is for awaking psych recs. Walter [name] is under involuntary commitment.      Walter Mire, DO 05/14/21 [phone]

## 2021-05-14 NOTE — Progress Notes (Signed)
05/14/2021  1706  Patient is walking around the room touching everything. He keeps removing his clothes. Patient needs constant reminders/reoriented.

## 2021-05-14 NOTE — BH Assessment (Signed)
Re-Assessment 05/14/2021:   Clinician to re-assess patient on this day 05/14/2021, per provider request. Prior to assessing patient Clinician completed a chart review. On 10/22, Prosperi, PA notes: "Walter [name] is a 69 y.o. male with past medical history significant for recent worsening dementia who was started on carbamazepine as well as lorazepam recently at his last appointment to assess his worsening dementia who presents accompanied by wife and daughter due to 1 night of worsening confusion, insomnia, dangerous behavior.  Family reports that patient was up all night, in the kitchen, turning water on, running outside, walking around the house with a baseball bat, sharp screwdriver and intent to "keep the house safe from intruders".  Patient has not had any recent falls or injuries.  Family believes that these problems have worsened since starting his new medications, prior to beginning the carbamazepine and lorazepam patient's primary symptom was some sundowning.  Family also reports that he has had some significant hallucinations including thinking that people are breaking into the house, hearing voices that are not there.  Patient is on memantine at baseline, dose unchanged at last visit.  Patient's wife reports that he did take his dose of memantine this morning, but usually takes it twice daily, patient has not had any Ativan or carbamazepine today".   Clinician met with patient via tele assessment after completing a chart review. Patient is observantly standing up but sat down on the hospital bed when prompt to do so by his nurse. He is unclear of why he was brought to the Emergency. Also, doesn't recall who brought him to [facility]. Patient was difficult to understand at times due to what appears to be an accent. However, answered questions when asked to do so. He was cooperative throughout the assessment.   Patient is not oriented to time as he states the month is February and year is 17. Patient  displays confusion about his whereabouts stating, "I'm somewhere locked up but not sure where". He is further oriented to person but not to the situation.   Patient denies suicidal ideations. Denies hx of suicide attempts and/or gestures. Denies hx of self mutilating behaviors. States that his appetite is good and he sleeps 4.5 hours per night. Denies HI. Also, denies hx of violate and/or aggressive behaviors even after this Clinician mentioning the reports of behaviors noted above. He does acknowledge auditory hallucinations of voices coming from the trash can in his kitchen at home. States that he hasn't heard voices in 3 weeks. Denies visual hallucinations. Denies that he has a significant mental health history. States that he was hospitalized at [facility] but unable to provide any related details.   Patient states that he is all of the three: married, single and divorced currently. He identifies his spouse as Walter [name] [phone]). Denies that he lives in a household with his spouse. He reports living with his brother and sister in law. He identifies having 20 children, youngest being 54 months, oldest 72 yrs old.   Disposition: Per Farris Has Ghali Morissette-Starkes, "WIll start Haldol $RemoveBefo'1mg'YIPlzNxIInL$  po BID to target behavioral disturbances with dementia. Patient appears to be responding to Ativan at this time, although we do not recommend ongoing use of Ativan or any other benzodiazapine in geriatric patients or dementia patients. Will make Ativan prn at this time, encourage limiting use at this time.  -Will continue to monitor for observation and hopes that we will stabilize and improve while in the ED. Have made multiple attempts to reach the spouse with  no avail, was successful in speaking with the daughter who seemed hesitant to pick him in the event he is psych cleared due to Covid and safety concerns.  -Ultimately patient will need an appointment and follow up with neuro psych or [facility] for management  of dementia".

## 2021-05-14 NOTE — Progress Notes (Signed)
CSW was consulted due to pt not safe to be home alone. CSW spoke with pt's daughter [place], she stated her concerns with her father being alone, in the home upon d/c. She reports there is no one in the home to watch him, as her mother works a full-time job. Pt's daughter stated that CSW should speak with pt's wife and provide contact information. Pt needed redirection several times during the conversation as pt kept trying to leave the hospital room.  CSW received a call from pt's wife she stated pt was diagnosed last year with dementia. She stated pt was doing ok up until a week ago. Pt's wife stated pt behaviors have gotten worse pt wonders from the home several times at night, and pt imaging people in the home that are not there. pt's wife stated pt carries a screwdriver and other items to use as weapons toward the people pt is imagining. Pt is currently under observation on a psych hold.     Valentina Shaggy.Slayden Mennenga, MSW, LCSWA [facility]  Transitions of Care Clinical Social Worker I Direct Dial: [phone]  Fax: [phone] [email or web address]

## 2021-05-14 NOTE — Progress Notes (Signed)
05/14/2021  1828  Patient is very busy in the room. Patient has pulled the code light, ripped pillow apart, removed the faucet from the sink. Patient is pleasant, good manners, not aggressive.

## 2021-05-14 NOTE — ED Notes (Signed)
PT resting in bed.

## 2021-05-14 NOTE — ED Notes (Signed)
Pt resting in bed. Pants remains off and pt remains covered.

## 2021-05-14 NOTE — ED Notes (Signed)
PT laying in bed awake. Calm and cooperative.

## 2021-05-14 NOTE — Consult Note (Signed)
  Patient continues to present with visual hallucinations, ongoing psychosis and behavioral disturbances. Initial labs were concerning for neutropenia (1.9 and ANC 0.9), repeat labs shown today are improved. Unclear if this neutropenia was 2/t COVID infection or Namenda, however he has been on Namenda for about 6 months. All other labs obtained have been normal to include CXR, EKG, urinalysis, CT Head, and carbamazepine level.   -WIll start Haldol 1mg  po BID to target behavioral disturbances with dementia. Patient appears to be responding to Ativan at this time, although we do not recommend ongoing use of Ativan or any other benzodiazapine in geriatric patients or dementia patients. Will make Ativan prn at this time, encourage limiting use at this time.  -Will continue to monitor for observation and hopes that we will stabilize and improve while in the ED. Have made multiple attempts to reach the spouse with no avail, was successful in speaking with the daughter who seemed hesitant to pick him in the event he is psych cleared due to Covid and safety concerns.  -Ultimately patient will need an appointment and follow up with neuro psych or [facility] for management of dementia.

## 2021-05-15 DIAGNOSIS — R451 Restlessness and agitation: Secondary | ICD-10-CM | POA: Diagnosis not present

## 2021-05-15 DIAGNOSIS — F03C11 Unspecified dementia, severe, with agitation: Secondary | ICD-10-CM

## 2021-05-15 DIAGNOSIS — U071 COVID-19: Secondary | ICD-10-CM | POA: Diagnosis not present

## 2021-05-15 DIAGNOSIS — F03911 Unspecified dementia, unspecified severity, with agitation: Secondary | ICD-10-CM | POA: Diagnosis not present

## 2021-05-15 MED ORDER — HALOPERIDOL 5 MG PO TABS
5.0000 mg | ORAL_TABLET | Freq: Two times a day (BID) | ORAL | Status: DC
  Administered 2021-05-15: 5 mg via ORAL
  Filled 2021-05-15: qty 1

## 2021-05-15 MED ORDER — ZIPRASIDONE MESYLATE 20 MG IM SOLR: 20.0000 mg | Freq: Once | INTRAMUSCULAR | Status: AC

## 2021-05-15 MED ORDER — STERILE WATER FOR INJECTION IJ SOLN
INTRAMUSCULAR | Status: AC
  Administered 2021-05-15: 1.2 mL
  Filled 2021-05-15: qty 10

## 2021-05-15 MED ORDER — ZIPRASIDONE MESYLATE 20 MG IM SOLR
INTRAMUSCULAR | Status: AC
  Administered 2021-05-15: 20 mg via INTRAMUSCULAR
  Filled 2021-05-15: qty 20

## 2021-05-15 NOTE — ED Notes (Signed)
Patient has been constantly moving all night. Scheduled and PRN meds given. Dalene Seltzer, MD made aware of patient behavior. Patient has been allowed to walk around since he has been calm and is not violent. Patient has been taking his clothes off and walking around with a sheet around him. Sitter has been at the door constantly redirecting patient to stop pulling things off the wall and picking up the mattress. Patient went to the bathroom and put the mirror on the floor and took the sink off the wall. Preston Fleeting, MD made aware and provided new orders. Clean sheets put on the bed and patient put in clean scrubs. Patient is in bed and sitter at door side. Patient is covid +.

## 2021-05-15 NOTE — BH Assessment (Addendum)
BHH Assessment Progress Note   Per Caryn Bee, NP, this pt does not require psychiatric hospitalization at this time.  Pt is psychiatrically cleared.  Pt presents under IVC initiated by EDP Mancel Bale, MD.  Fredna Dow has spoken to pt's wife, Phu Record, with whom pt cohabitates, and she is not willing to pick pt up, noting that she is no longer capable of providing care for him in the community.  A TOC consult has been ordered to address pt's psychosocial needs.   Discharge instructions include referral information for area psychiatrists providing specialty care for geriatric patients  These details have been staffed with EDP Pricilla Loveless, MD.  It has been decided that pt will remain under IVC for the time being in case pt needs medication or tries to elope from [facility] while arrangements are being made for him to return to the community.  Dr Criss Alvine, Donato Schultz, LCSW and pt's nurse, Addison Naegeli, have been notified.  Doylene Canning, MA Triage Specialist [phone]   Addendum:  Family has arrived to pick pt up.  IVC rescinded by EDP Meridee Score, MD.  Pertinent parties from above have been notified.  Doylene Canning, [place] [facility] Coordinator [phone]

## 2021-05-15 NOTE — ED Provider Notes (Signed)
Patient has been agitated and at times combative.  Attempts to sedate him with lorazepam and haloperidol have been ineffective.  I went to evaluate him and the patient is naked and walking around the room, not able to be directed verbally.  He will need additional sedation for patient and staff safety.  He is given a dose of ziprasidone.  Patient reevaluated and found to be sleeping quietly.  CRITICAL CARE Performed by: Dione Booze Total critical care time: 35 minutes Critical care time was exclusive of separately billable procedures and treating other patients. Critical care was necessary to treat or prevent imminent or life-threatening deterioration. Critical care was time spent personally by me on the following activities: development of treatment plan with patient and/or surrogate as well as nursing, discussions with consultants, evaluation of patient's response to treatment, examination of patient, obtaining history from patient or surrogate, ordering and performing treatments and interventions, ordering and review of laboratory studies, ordering and review of radiographic studies, pulse oximetry and re-evaluation of patient's condition.   Dione Booze, MD 05/15/21 [phone]

## 2021-05-15 NOTE — Consult Note (Signed)
Patient behavioral disturbances have improved, and he continues to respond to Haldol as evident by less disruptive behaviors, agitation, aggression, and psychosis. 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   Further discussion with wife shows she has some ongoing safety concerns and she is not able to provide care for this patient 24/7. She endorses hallucinations, disruptive behaviors, wandering behaviors, confusion, and cites leaving the home multiple times a night. She remains concerned about his safety and safety of others at this time. The wife is also of age and no longer safes leaving the home in the middle of the night to look for him or convince him to come back into the house. She also works during the day, and is not in a position to take time off of work or manage his care if granted time off. She appears to be experiencing care giver role strain, and has limitations at this time that warrant her ability to provide care for her husband.   As it is noted above patient does not have a safe or appropriate disposition at this time. Unfortunately he will need to remain in the Emergency room until placement or safe disposition is found. With nights getting colder, and likely progression of his dementia patient will need to seek higher level of care.   patient admitted with worsening cognitive impairment, wandering behaviors, and decrease in mental status.   Discussed with his wife that he may experience some confusion, agitation, and restlessness as well as Sundowners this is completely normal behavior in a person of this age. It is felt that patient may need higher level of care due to worsening memory impairment, confusion, wandering behaviors, and restlessness. Discussed with wife that leaving him in the ER will only worsen his condition. His wife is concerned about his inability to remember, increase  confusion, increase in physical activity ( wandering), memory problems, and some personality changes. She is educated that these signs are normal.  Patient behaviors are consistent with a person with mild-moderate stage dementia and other memory loss. SNF,  ALF, Home Health unit would be appropriate as they are well equipped to help elderly persons with memory loss, maintain cognitive skills and help with quality of life. Patient is able to take care of self, however needs more social interaction and person centered care to help with worsening memory impairment and difficult dementia behaviors. Wife both verbalized understanding. Writer made all attempts to answer questions and address all concerns with her. Writer recapped phone call and treatment and all parties agreed to continue to seek placement.   -WIll increase Haldol 5mg  po BID to target behavioral disturbances with dementia. Patient appears to be responding to Ativan at this time, although we do not recommend ongoing use of Ativan or any other benzodiazapine in geriatric patients or dementia patients. Will make Ativan prn at this time, encourage limiting use at this time.  -Will psych clear at this time  -Will continue working with [facility] to assist with placement.

## 2021-05-15 NOTE — Discharge Instructions (Signed)
For your behavioral health needs you are advised to follow up with an outpatient psychiatrist.  Contact one of the following providers at your earliest opportunity to schedule an intake appointment:       Gerald Plovsky, MD      [facility]      [address], [address]      [place], [place] [address]      [phone]       Vincent Izediuno, MD      [facility]      [address]      [place], [place] [address]      [phone]   

## 2021-05-15 NOTE — ED Provider Notes (Signed)
Emergency Medicine Observation Re-evaluation Note  SHERIF MILLSPAUGH is a 70 y.o. male, seen on rounds today.  Pt initially presented to the ED for complaints of Hallucinations Currently, the patient is asleep/sedated.  Physical Exam  BP (!) 145/77 (BP Location: Right Arm)   Pulse 79   Temp 98.2 F (36.8 C) (Oral)   Resp 20   Ht 6\' 4"  (1.93 m)   SpO2 95%  Physical Exam General: asleep Lungs: no increased WOB Psych: sedated  ED Course / MDM  EKG:EKG Interpretation  Date/Time:  Saturday May 12 2021 10:50:34 EDT Ventricular Rate:  62 PR Interval:  150 QRS Duration: 90 QT Interval:  400 QTC Calculation: 407 R Axis:   37 Text Interpretation: Sinus rhythm No old tracing to compare Confirmed by 06-25-1979 [phone]) on 05/12/2021 11:57:01 AM  I have reviewed the labs performed to date as well as medications administered while in observation.  Recent changes in the last 24 hours include IM medications for agitation, including haldol, ativan and geodon.  Plan  Current plan is for psychiatry reassessment. CAMARI WISHAM is under involuntary commitment.      Juluis Mire, MD 05/15/21 [phone]

## 2021-05-15 NOTE — [place] FL2 (Signed)
  [place] MEDICAID FL2 LEVEL OF CARE SCREENING TOOL     IDENTIFICATION  Patient Name: Walter [name] Birthdate: April 16, 1951 Sex: male Admission Date (Current Location): 05/12/2021  [place] and [place] Number:  Haynes Bast [id number] Facility and Address:  [facility],  [address] [place] [address]      Provider Number: [id number]  Attending Physician Name and Address:  Default, Provider, MD  Relative Name and Phone Number:  Burris, Matherne (Spouse)   [phone] [facility] Phone)    Current Level of Care: Hospital Recommended Level of Care: Memory Care Prior Approval Number:    Date Approved/Denied:   PASRR Number: Pending  Discharge Plan: Other (Comment) (Memory care)    Current Diagnoses: There are no problems to display for this patient.   Orientation RESPIRATION BLADDER Height & Weight     Self  Normal Continent Weight:   Height:  6\' 4"  (193 cm)  BEHAVIORAL SYMPTOMS/MOOD NEUROLOGICAL BOWEL NUTRITION STATUS  Wanderer   Continent Diet (regular)  AMBULATORY STATUS COMMUNICATION OF NEEDS Skin   Independent Verbally Normal                       Personal Care Assistance Level of Assistance  Bathing, Feeding, Dressing Bathing Assistance: Independent Feeding assistance: Independent Dressing Assistance: Independent     Functional Limitations Info  Sight, Hearing, Speech Sight Info: Adequate Hearing Info: Adequate Speech Info: Adequate    SPECIAL CARE FACTORS FREQUENCY                       Contractures Contractures Info: Not present    Additional Factors Info  Code Status, Allergies, Psychotropic Code Status Info: full Allergies Info: No known allergies Psychotropic Info: haloperidol (HALDOL) tablet 5 mg         Current Medications (05/15/2021):  This is the current hospital active medication list Current Facility-Administered Medications  Medication Dose Route Frequency Provider Last Rate Last Admin   haloperidol (HALDOL)  tablet 5 mg  5 mg Oral BID 05/17/2021, FNP   5 mg at 05/15/21 0954   LORazepam (ATIVAN) tablet 0.5 mg  0.5 mg Oral Q6H PRN 05/17/21, FNP   0.5 mg at 05/15/21 0954   memantine (NAMENDA) tablet 10 mg  10 mg Oral BID Leevy-Johnson, Brooke A, NP   10 mg at 05/15/21 05/17/21   Current Outpatient Medications  Medication Sig Dispense Refill   amLODipine (NORVASC) 5 MG tablet Take 5 mg by mouth daily.     carbamazepine (TEGRETOL) 200 MG tablet Take 200 mg by mouth in the morning and at bedtime.     irbesartan-hydrochlorothiazide (AVALIDE) 150-12.5 MG tablet Take 1 tablet by mouth daily.     LORazepam (ATIVAN) 1 MG tablet Take 1 mg by mouth in the morning and at bedtime.     memantine (NAMENDA) 10 MG tablet Take 10 mg by mouth in the morning and at bedtime.       Discharge Medications: Please see discharge summary for a list of discharge medications.  Relevant Imaging Results:  Relevant Lab Results:   Additional Information SSN: [id number]  [id number] Lynesha Bango, LCSW

## 2021-05-15 NOTE — Progress Notes (Signed)
CSW reviewed pt chart, pt is psych cleared and stable for d/c. However, per psych pt does not have a safe discharge as pt's family is unable to care for pt at home. Pt's wife works full-time and does not have anyone to assist with watching pt. Per pt's wife, they do not have the funds to get PCS in the home at this time.   Pt has Medicaid that will pay for LTC, however, pt will need special assistance Medicaid in place and pt is COVID-positive. CSW contacted [facility] and spoke with Mindy, she reported having an open bed in their Memory Care unit. She requested pt's FL2. CSW emailed pt's FL2 to [employer]. CSW spoke with [facility] pt was denied a bed offer. CSW contacted [facility] and left VM.   12:15pm CSW spoke with pt's wife and informed her that pt will need special assistance medicaid pt's wife stated she will go and apply.   3:00pm  CSW received a call from [facility], Dede Barns, she stated she would need to see pt in the home to assess pt. She reported pt will need a TB Skin, and a Flu shot if he is a good fit. She also stated the facility will need to very pt's finances. CSW spoke with pt wife and provided an update from [place].  CSW explained to pt's wife pt is psych cleared and stable for d/c and she will need to make a plan for his d/c. Pt's wife stated her concerns with pt being d/c. CSW provided [place] contact information to pt's wife. CSW also provided contact numbers to Privacy duty care agencies. Pt's wife stated she will call CSW back to provide a time she will pick pt up. CSW continues to follow.   Valentina Shaggy.Demonta Wombles, MSW, LCSWA [facility]  Transitions of Care Clinical Social Worker I Direct Dial: [phone]  Fax: [phone] [email or web address]

## 2021-05-15 NOTE — ED Notes (Signed)
Informed patient daughter that only one update is given per day, and whom ever gets the update needs to relay information to other family members.

## 2021-05-15 NOTE — ED Provider Notes (Signed)
Informed by psychiatry that patient is to be psychiatrically cleared and discharged to family with outpatient follow-up.  They asked if I would rescind his IVC.   Terrilee Files, MD 05/15/21 2013

## 2021-05-16 ENCOUNTER — Encounter: Payer: Self-pay | Admitting: Family

## 2021-05-17 NOTE — Progress Notes (Signed)
Post d/c 05/17/21  8:30am CSW received call from pt's wife , she requested pt's FL2 to be emailed to her and sent to [employer] again. CSW sent out documents.   Valentina Shaggy.Orvell Careaga, MSW, LCSWA [facility]  Transitions of Care Clinical Social Worker I Direct Dial: [phone]  Fax: [phone] [email or web address]

## 2021-05-19 DIAGNOSIS — Z23 Encounter for immunization: Secondary | ICD-10-CM | POA: Diagnosis not present

## 2021-05-19 DIAGNOSIS — Z111 Encounter for screening for respiratory tuberculosis: Secondary | ICD-10-CM | POA: Diagnosis not present

## 2021-05-21 DIAGNOSIS — Z111 Encounter for screening for respiratory tuberculosis: Secondary | ICD-10-CM | POA: Diagnosis not present

## 2021-05-24 DIAGNOSIS — R443 Hallucinations, unspecified: Secondary | ICD-10-CM | POA: Diagnosis not present

## 2021-05-24 DIAGNOSIS — I10 Essential (primary) hypertension: Secondary | ICD-10-CM | POA: Diagnosis not present

## 2021-05-24 DIAGNOSIS — F03C2 Unspecified dementia, severe, with psychotic disturbance: Secondary | ICD-10-CM | POA: Diagnosis not present

## 2021-06-04 DIAGNOSIS — F060 Psychotic disorder with hallucinations due to known physiological condition: Secondary | ICD-10-CM | POA: Diagnosis not present

## 2021-06-04 DIAGNOSIS — F0280 Dementia in other diseases classified elsewhere without behavioral disturbance: Secondary | ICD-10-CM | POA: Diagnosis not present

## 2021-06-04 DIAGNOSIS — I10 Essential (primary) hypertension: Secondary | ICD-10-CM | POA: Diagnosis not present

## 2021-06-13 DIAGNOSIS — I10 Essential (primary) hypertension: Secondary | ICD-10-CM | POA: Diagnosis not present

## 2021-06-13 DIAGNOSIS — Z79899 Other long term (current) drug therapy: Secondary | ICD-10-CM | POA: Diagnosis not present

## 2021-06-25 DIAGNOSIS — F060 Psychotic disorder with hallucinations due to known physiological condition: Secondary | ICD-10-CM | POA: Diagnosis not present

## 2021-06-25 DIAGNOSIS — F0280 Dementia in other diseases classified elsewhere without behavioral disturbance: Secondary | ICD-10-CM | POA: Diagnosis not present

## 2021-06-25 DIAGNOSIS — I10 Essential (primary) hypertension: Secondary | ICD-10-CM | POA: Diagnosis not present

## 2021-06-25 DIAGNOSIS — D649 Anemia, unspecified: Secondary | ICD-10-CM | POA: Diagnosis not present

## 2021-07-02 ENCOUNTER — Other Ambulatory Visit: Payer: Self-pay

## 2021-07-02 ENCOUNTER — Encounter (HOSPITAL_COMMUNITY): Payer: Self-pay

## 2021-07-02 ENCOUNTER — Emergency Department (HOSPITAL_COMMUNITY): Payer: Medicare Other

## 2021-07-02 ENCOUNTER — Emergency Department (HOSPITAL_COMMUNITY)
Admission: EM | Admit: 2021-07-02 | Discharge: 2021-07-03 | Disposition: A | Discharge status: Home / Self Care | Payer: Medicare Other | Attending: Emergency Medicine | Admitting: Emergency Medicine

## 2021-07-02 DIAGNOSIS — I10 Essential (primary) hypertension: Secondary | ICD-10-CM | POA: Diagnosis not present

## 2021-07-02 DIAGNOSIS — F0390 Unspecified dementia without behavioral disturbance: Secondary | ICD-10-CM | POA: Insufficient documentation

## 2021-07-02 DIAGNOSIS — R9431 Abnormal electrocardiogram [ECG] [EKG]: Secondary | ICD-10-CM | POA: Diagnosis not present

## 2021-07-02 DIAGNOSIS — D649 Anemia, unspecified: Secondary | ICD-10-CM | POA: Diagnosis not present

## 2021-07-02 DIAGNOSIS — S0990XA Unspecified injury of head, initial encounter: Secondary | ICD-10-CM | POA: Diagnosis not present

## 2021-07-02 DIAGNOSIS — R531 Weakness: Secondary | ICD-10-CM | POA: Insufficient documentation

## 2021-07-02 DIAGNOSIS — S199XXA Unspecified injury of neck, initial encounter: Secondary | ICD-10-CM | POA: Diagnosis not present

## 2021-07-02 DIAGNOSIS — G9520 Unspecified cord compression: Secondary | ICD-10-CM | POA: Diagnosis not present

## 2021-07-02 DIAGNOSIS — R2689 Other abnormalities of gait and mobility: Secondary | ICD-10-CM | POA: Diagnosis not present

## 2021-07-02 DIAGNOSIS — W19XXXA Unspecified fall, initial encounter: Secondary | ICD-10-CM | POA: Insufficient documentation

## 2021-07-02 DIAGNOSIS — S0003XA Contusion of scalp, initial encounter: Secondary | ICD-10-CM | POA: Diagnosis not present

## 2021-07-02 DIAGNOSIS — R2243 Localized swelling, mass and lump, lower limb, bilateral: Secondary | ICD-10-CM | POA: Diagnosis not present

## 2021-07-02 DIAGNOSIS — Z20822 Contact with and (suspected) exposure to covid-19: Secondary | ICD-10-CM | POA: Insufficient documentation

## 2021-07-02 DIAGNOSIS — M2578 Osteophyte, vertebrae: Secondary | ICD-10-CM | POA: Diagnosis not present

## 2021-07-02 DIAGNOSIS — F0280 Dementia in other diseases classified elsewhere without behavioral disturbance: Secondary | ICD-10-CM | POA: Diagnosis not present

## 2021-07-02 DIAGNOSIS — Y92129 Unspecified place in nursing home as the place of occurrence of the external cause: Secondary | ICD-10-CM | POA: Diagnosis not present

## 2021-07-02 DIAGNOSIS — M4802 Spinal stenosis, cervical region: Secondary | ICD-10-CM | POA: Diagnosis not present

## 2021-07-02 DIAGNOSIS — M47812 Spondylosis without myelopathy or radiculopathy, cervical region: Secondary | ICD-10-CM | POA: Diagnosis not present

## 2021-07-02 DIAGNOSIS — R4182 Altered mental status, unspecified: Secondary | ICD-10-CM | POA: Diagnosis not present

## 2021-07-02 DIAGNOSIS — F060 Psychotic disorder with hallucinations due to known physiological condition: Secondary | ICD-10-CM | POA: Diagnosis not present

## 2021-07-02 DIAGNOSIS — Z043 Encounter for examination and observation following other accident: Secondary | ICD-10-CM | POA: Diagnosis not present

## 2021-07-02 DIAGNOSIS — G319 Degenerative disease of nervous system, unspecified: Secondary | ICD-10-CM | POA: Diagnosis not present

## 2021-07-02 HISTORY — DX: Hallucinations, unspecified: R44.3

## 2021-07-02 HISTORY — DX: Unspecified dementia, unspecified severity, without behavioral disturbance, psychotic disturbance, mood disturbance, and anxiety: F03.90

## 2021-07-02 LAB — CBC WITH DIFFERENTIAL/PLATELET
Abs Immature Granulocytes: 0 10*3/uL (ref 0.00–0.07)
Basophils Absolute: 0 10*3/uL (ref 0.0–0.1)
Basophils Relative: 1 %
Eosinophils Absolute: 0 10*3/uL (ref 0.0–0.5)
Eosinophils Relative: 0 %
HCT: 32.2 % — ABNORMAL LOW (ref 39.0–52.0)
Hemoglobin: 11 g/dL — ABNORMAL LOW (ref 13.0–17.0)
Immature Granulocytes: 0 %
Lymphocytes Relative: 29 %
Lymphs Abs: 1.2 10*3/uL (ref 0.7–4.0)
MCH: 33.7 pg (ref 26.0–34.0)
MCHC: 34.2 g/dL (ref 30.0–36.0)
MCV: 98.8 fL (ref 80.0–100.0)
Monocytes Absolute: 0.5 10*3/uL (ref 0.1–1.0)
Monocytes Relative: 12 %
Neutro Abs: 2.4 10*3/uL (ref 1.7–7.7)
Neutrophils Relative %: 58 %
Platelets: 174 10*3/uL (ref 150–400)
RBC: 3.26 MIL/uL — ABNORMAL LOW (ref 4.22–5.81)
RDW: 12.8 % (ref 11.5–15.5)
WBC: 4.1 10*3/uL (ref 4.0–10.5)
nRBC: 0 % (ref 0.0–0.2)

## 2021-07-02 LAB — I-STAT VENOUS BLOOD GAS, ED
Acid-Base Excess: 2 mmol/L (ref 0.0–2.0)
Bicarbonate: 26.7 mmol/L (ref 20.0–28.0)
Calcium, Ion: 1.16 mmol/L (ref 1.15–1.40)
HCT: 32 % — ABNORMAL LOW (ref 39.0–52.0)
Hemoglobin: 10.9 g/dL — ABNORMAL LOW (ref 13.0–17.0)
O2 Saturation: 99 %
Potassium: 3.9 mmol/L (ref 3.5–5.1)
Sodium: 142 mmol/L (ref 135–145)
TCO2: 28 mmol/L (ref 22–32)
pCO2, Ven: 39.7 mmHg — ABNORMAL LOW (ref 44.0–60.0)
pH, Ven: 7.436 — ABNORMAL HIGH (ref 7.250–7.430)
pO2, Ven: 142 mmHg — ABNORMAL HIGH (ref 32.0–45.0)

## 2021-07-02 LAB — COMPREHENSIVE METABOLIC PANEL
ALT: 24 U/L (ref 0–44)
AST: 36 U/L (ref 15–41)
Albumin: 4.1 g/dL (ref 3.5–5.0)
Alkaline Phosphatase: 39 U/L (ref 38–126)
Anion gap: 6 (ref 5–15)
BUN: 18 mg/dL (ref 8–23)
CO2: 25 mmol/L (ref 22–32)
Calcium: 8.8 mg/dL — ABNORMAL LOW (ref 8.9–10.3)
Chloride: 107 mmol/L (ref 98–111)
Creatinine, Ser: 1.48 mg/dL — ABNORMAL HIGH (ref 0.61–1.24)
GFR, Estimated: 50 mL/min — ABNORMAL LOW (ref >60)
Glucose, Bld: 98 mg/dL (ref 70–99)
Potassium: 3.9 mmol/L (ref 3.5–5.1)
Sodium: 138 mmol/L (ref 135–145)
Total Bilirubin: 0.8 mg/dL (ref 0.3–1.2)
Total Protein: 6.9 g/dL (ref 6.5–8.1)

## 2021-07-02 LAB — CBG MONITORING, ED: Glucose-Capillary: 100 mg/dL — ABNORMAL HIGH (ref 70–99)

## 2021-07-02 LAB — AMMONIA: Ammonia: 38 umol/L — ABNORMAL HIGH (ref 9–35)

## 2021-07-02 NOTE — ED Provider Notes (Signed)
Care assumed from Dr. Jacqulyn Bath, patient from a skilled nursing facility following an unwitnessed fall, questionable slight change in mental status.  Labs are unremarkable.  CT scan reports are pending as is urinalysis.  Anticipate return to skilled nursing facility.  CT shows no acute process, urinalysis is negative for infection. He is discharged back to his skilled nursing facility.   Dione Booze, MD 07/03/21 [phone]
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
## 2021-07-02 NOTE — ED Triage Notes (Signed)
Pt BIB [facility] from [facility]. Pt had an unwitnessed fall. Pt denies pain or injury. Staff reported that pt was altered from baseline. EMS report pt being alert with a GCS of 14.   EMS Vitals: 116/68 HR 72 98% R/A CBG 117

## 2021-07-02 NOTE — ED Notes (Signed)
Patient transported to CT 

## 2021-07-03 DIAGNOSIS — S0003XA Contusion of scalp, initial encounter: Secondary | ICD-10-CM | POA: Diagnosis not present

## 2021-07-03 LAB — URINALYSIS, ROUTINE W REFLEX MICROSCOPIC
Bilirubin Urine: NEGATIVE
Glucose, UA: NEGATIVE mg/dL
Ketones, ur: NEGATIVE mg/dL
Leukocytes,Ua: NEGATIVE
Nitrite: NEGATIVE
Protein, ur: NEGATIVE mg/dL
Specific Gravity, Urine: 1.02 (ref 1.005–1.030)
pH: 6 (ref 5.0–8.0)

## 2021-07-03 LAB — RESP PANEL BY RT-PCR (FLU A&B, COVID) ARPGX2
Influenza A by PCR: NEGATIVE
Influenza B by PCR: NEGATIVE
SARS Coronavirus 2 by RT PCR: NEGATIVE

## 2021-07-03 LAB — URINALYSIS, MICROSCOPIC (REFLEX)
RBC / HPF: >50 RBC/hpf (ref 0–5)
Squamous Epithelial / HPF: NONE SEEN (ref 0–5)

## 2021-07-03 NOTE — ED Notes (Signed)
[facility] called for transport.  

## 2021-07-03 NOTE — ED Notes (Signed)
Pt discharged and wheeled out of the ED on a stretcher with [facility] crew. 

## 2021-07-04 DIAGNOSIS — R300 Dysuria: Secondary | ICD-10-CM | POA: Diagnosis not present

## 2021-07-06 DIAGNOSIS — G301 Alzheimer's disease with late onset: Secondary | ICD-10-CM | POA: Diagnosis not present

## 2021-07-06 DIAGNOSIS — F03B11 Unspecified dementia, moderate, with agitation: Secondary | ICD-10-CM | POA: Diagnosis not present

## 2021-07-07 LAB — CULTURE, BLOOD (ROUTINE X 2)
Culture: NO GROWTH
Culture: NO GROWTH
Special Requests: ADEQUATE
Special Requests: ADEQUATE

## 2021-07-09 DIAGNOSIS — F060 Psychotic disorder with hallucinations due to known physiological condition: Secondary | ICD-10-CM | POA: Diagnosis not present

## 2021-07-09 DIAGNOSIS — I10 Essential (primary) hypertension: Secondary | ICD-10-CM | POA: Diagnosis not present

## 2021-07-09 DIAGNOSIS — D649 Anemia, unspecified: Secondary | ICD-10-CM | POA: Diagnosis not present

## 2021-07-09 DIAGNOSIS — F0280 Dementia in other diseases classified elsewhere without behavioral disturbance: Secondary | ICD-10-CM | POA: Diagnosis not present

## 2021-07-11 DIAGNOSIS — Z79899 Other long term (current) drug therapy: Secondary | ICD-10-CM | POA: Diagnosis not present

## 2021-07-13 DIAGNOSIS — R2681 Unsteadiness on feet: Secondary | ICD-10-CM | POA: Diagnosis not present

## 2021-07-13 DIAGNOSIS — I10 Essential (primary) hypertension: Secondary | ICD-10-CM | POA: Diagnosis not present

## 2021-07-13 DIAGNOSIS — M6281 Muscle weakness (generalized): Secondary | ICD-10-CM | POA: Diagnosis not present

## 2021-07-13 DIAGNOSIS — F0280 Dementia in other diseases classified elsewhere without behavioral disturbance: Secondary | ICD-10-CM | POA: Diagnosis not present

## 2021-07-13 DIAGNOSIS — R2689 Other abnormalities of gait and mobility: Secondary | ICD-10-CM | POA: Diagnosis not present

## 2021-07-13 DIAGNOSIS — F060 Psychotic disorder with hallucinations due to known physiological condition: Secondary | ICD-10-CM | POA: Diagnosis not present

## 2021-07-17 DIAGNOSIS — F0280 Dementia in other diseases classified elsewhere without behavioral disturbance: Secondary | ICD-10-CM | POA: Diagnosis not present

## 2021-07-17 DIAGNOSIS — R2689 Other abnormalities of gait and mobility: Secondary | ICD-10-CM | POA: Diagnosis not present

## 2021-07-17 DIAGNOSIS — R2681 Unsteadiness on feet: Secondary | ICD-10-CM | POA: Diagnosis not present

## 2021-07-17 DIAGNOSIS — F060 Psychotic disorder with hallucinations due to known physiological condition: Secondary | ICD-10-CM | POA: Diagnosis not present

## 2021-07-17 DIAGNOSIS — I10 Essential (primary) hypertension: Secondary | ICD-10-CM | POA: Diagnosis not present

## 2021-07-17 DIAGNOSIS — M6281 Muscle weakness (generalized): Secondary | ICD-10-CM | POA: Diagnosis not present

## 2021-07-18 ENCOUNTER — Other Ambulatory Visit: Payer: Self-pay

## 2021-07-18 ENCOUNTER — Emergency Department (HOSPITAL_COMMUNITY)
Admission: EM | Admit: 2021-07-18 | Discharge: 2021-07-18 | Disposition: A | Discharge status: Home / Self Care | Payer: Medicare Other | Attending: Emergency Medicine | Admitting: Emergency Medicine

## 2021-07-18 ENCOUNTER — Emergency Department (HOSPITAL_COMMUNITY): Payer: Medicare Other

## 2021-07-18 DIAGNOSIS — R451 Restlessness and agitation: Secondary | ICD-10-CM | POA: Diagnosis not present

## 2021-07-18 DIAGNOSIS — F29 Unspecified psychosis not due to a substance or known physiological condition: Secondary | ICD-10-CM | POA: Diagnosis not present

## 2021-07-18 DIAGNOSIS — F0390 Unspecified dementia without behavioral disturbance: Secondary | ICD-10-CM | POA: Diagnosis not present

## 2021-07-18 DIAGNOSIS — R001 Bradycardia, unspecified: Secondary | ICD-10-CM | POA: Diagnosis not present

## 2021-07-18 DIAGNOSIS — R0902 Hypoxemia: Secondary | ICD-10-CM | POA: Diagnosis not present

## 2021-07-18 DIAGNOSIS — M79675 Pain in left toe(s): Secondary | ICD-10-CM | POA: Diagnosis not present

## 2021-07-18 DIAGNOSIS — M6281 Muscle weakness (generalized): Secondary | ICD-10-CM | POA: Diagnosis not present

## 2021-07-18 DIAGNOSIS — M79674 Pain in right toe(s): Secondary | ICD-10-CM | POA: Diagnosis not present

## 2021-07-18 DIAGNOSIS — R404 Transient alteration of awareness: Secondary | ICD-10-CM | POA: Diagnosis not present

## 2021-07-18 DIAGNOSIS — R2681 Unsteadiness on feet: Secondary | ICD-10-CM | POA: Diagnosis not present

## 2021-07-18 DIAGNOSIS — D649 Anemia, unspecified: Secondary | ICD-10-CM | POA: Diagnosis not present

## 2021-07-18 DIAGNOSIS — F0280 Dementia in other diseases classified elsewhere without behavioral disturbance: Secondary | ICD-10-CM | POA: Diagnosis not present

## 2021-07-18 DIAGNOSIS — J9811 Atelectasis: Secondary | ICD-10-CM | POA: Diagnosis not present

## 2021-07-18 DIAGNOSIS — R4182 Altered mental status, unspecified: Secondary | ICD-10-CM | POA: Diagnosis not present

## 2021-07-18 DIAGNOSIS — R2689 Other abnormalities of gait and mobility: Secondary | ICD-10-CM | POA: Diagnosis not present

## 2021-07-18 DIAGNOSIS — I10 Essential (primary) hypertension: Secondary | ICD-10-CM | POA: Diagnosis not present

## 2021-07-18 DIAGNOSIS — Z7401 Bed confinement status: Secondary | ICD-10-CM | POA: Diagnosis not present

## 2021-07-18 DIAGNOSIS — M2550 Pain in unspecified joint: Secondary | ICD-10-CM | POA: Diagnosis not present

## 2021-07-18 DIAGNOSIS — B351 Tinea unguium: Secondary | ICD-10-CM | POA: Diagnosis not present

## 2021-07-18 DIAGNOSIS — F03911 Unspecified dementia, unspecified severity, with agitation: Secondary | ICD-10-CM

## 2021-07-18 DIAGNOSIS — F060 Psychotic disorder with hallucinations due to known physiological condition: Secondary | ICD-10-CM | POA: Diagnosis not present

## 2021-07-18 DIAGNOSIS — Z743 Need for continuous supervision: Secondary | ICD-10-CM | POA: Diagnosis not present

## 2021-07-18 LAB — URINALYSIS, ROUTINE W REFLEX MICROSCOPIC
Bacteria, UA: NONE SEEN
Bilirubin Urine: NEGATIVE
Glucose, UA: NEGATIVE mg/dL
Ketones, ur: NEGATIVE mg/dL
Leukocytes,Ua: NEGATIVE
Nitrite: NEGATIVE
Protein, ur: NEGATIVE mg/dL
Specific Gravity, Urine: 1.015 (ref 1.005–1.030)
pH: 5 (ref 5.0–8.0)

## 2021-07-18 LAB — COMPREHENSIVE METABOLIC PANEL
ALT: 18 U/L (ref 0–44)
AST: 21 U/L (ref 15–41)
Albumin: 4.2 g/dL (ref 3.5–5.0)
Alkaline Phosphatase: 40 U/L (ref 38–126)
Anion gap: 9 (ref 5–15)
BUN: 20 mg/dL (ref 8–23)
CO2: 24 mmol/L (ref 22–32)
Calcium: 9.3 mg/dL (ref 8.9–10.3)
Chloride: 105 mmol/L (ref 98–111)
Creatinine, Ser: 1.14 mg/dL (ref 0.61–1.24)
GFR, Estimated: >60 mL/min (ref >60)
Glucose, Bld: 102 mg/dL — ABNORMAL HIGH (ref 70–99)
Potassium: 3.9 mmol/L (ref 3.5–5.1)
Sodium: 138 mmol/L (ref 135–145)
Total Bilirubin: 0.9 mg/dL (ref 0.3–1.2)
Total Protein: 7.2 g/dL (ref 6.5–8.1)

## 2021-07-18 LAB — CBC WITH DIFFERENTIAL/PLATELET
Abs Immature Granulocytes: 0 10*3/uL (ref 0.00–0.07)
Basophils Absolute: 0 10*3/uL (ref 0.0–0.1)
Basophils Relative: 1 %
Eosinophils Absolute: 0 10*3/uL (ref 0.0–0.5)
Eosinophils Relative: 1 %
HCT: 33.2 % — ABNORMAL LOW (ref 39.0–52.0)
Hemoglobin: 11.6 g/dL — ABNORMAL LOW (ref 13.0–17.0)
Immature Granulocytes: 0 %
Lymphocytes Relative: 23 %
Lymphs Abs: 0.9 10*3/uL (ref 0.7–4.0)
MCH: 34.6 pg — ABNORMAL HIGH (ref 26.0–34.0)
MCHC: 34.9 g/dL (ref 30.0–36.0)
MCV: 99.1 fL (ref 80.0–100.0)
Monocytes Absolute: 0.6 10*3/uL (ref 0.1–1.0)
Monocytes Relative: 16 %
Neutro Abs: 2.3 10*3/uL (ref 1.7–7.7)
Neutrophils Relative %: 59 %
Platelets: 194 10*3/uL (ref 150–400)
RBC: 3.35 MIL/uL — ABNORMAL LOW (ref 4.22–5.81)
RDW: 12.4 % (ref 11.5–15.5)
WBC: 3.9 10*3/uL — ABNORMAL LOW (ref 4.0–10.5)
nRBC: 0 % (ref 0.0–0.2)

## 2021-07-18 MED ORDER — LORAZEPAM 2 MG/ML IJ SOLN
1.0000 mg | Freq: Once | INTRAMUSCULAR | Status: AC
  Administered 2021-07-18: 06:00:00 1 mg via INTRAVENOUS
  Filled 2021-07-18: qty 1

## 2021-07-18 MED ORDER — HALOPERIDOL 5 MG PO TABS
5.0000 mg | ORAL_TABLET | Freq: Four times a day (QID) | ORAL | 0 refills | Status: DC | PRN
Start: 2021-07-18 — End: 2021-09-14

## 2021-07-18 NOTE — ED Notes (Signed)
Attempted report 3 times to [facility]. No response.

## 2021-07-18 NOTE — ED Notes (Signed)
Called [facility] to transport patient to [facility].

## 2021-07-18 NOTE — Discharge Instructions (Addendum)
Please give haloperidol as needed for agitation. Discuss with his primary care provider other medications for agitation.
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
## 2021-07-18 NOTE — ED Provider Notes (Signed)
[facility] EMERGENCY DEPARTMENT Provider Note   CSN: [id number] Arrival date & time:        History No chief complaint on file.   Walter [name] is a 70 y.o. male.  The history is provided by the nursing home and the EMS personnel. The history is limited by the condition of the patient (Altered mental status).  He has history of dementia and was transferred from a skilled nursing facility where he was reported to be wandering around and entering other patient's rooms.  EMS reports patient was aggressive with them and required sedation.  He was given haloperidol 5 mg and midazolam 5 mg.   Past Medical History:  Diagnosis Date   Dementia (HCC)    Hallucinations     There are no problems to display for this patient.   No past surgical history on file.     No family history on file.  Social History   Tobacco Use   Smoking status: Never   Smokeless tobacco: Never  Substance Use Topics   Alcohol use: Not Currently    Home Medications Prior to Admission medications   Medication Sig Start Date End Date Taking? Authorizing Provider  acetaminophen (TYLENOL) 500 MG tablet Take 500 mg by mouth every 6 (six) hours as needed for headache, fever or mild pain.    [provider]  aluminum-magnesium hydroxide 200-200 MG/5ML suspension Take 30 mLs by mouth as needed for indigestion (heartburn).    [provider]  amLODipine (NORVASC) 5 MG tablet Take 5 mg by mouth daily. 11/17/17   [provider]  guaiFENesin (ROBITUSSIN) 100 MG/5ML liquid Take 10 mLs by mouth every 6 (six) hours as needed for cough or to loosen phlegm.    [provider]  haloperidol (HALDOL) 5 MG tablet Take 5-10 mg by mouth See admin instructions. 5 mg  in the morning and afternoon 10 mg at bedtime    [provider]  loperamide (IMODIUM) 2 MG capsule Take 2 mg by mouth as needed for diarrhea or loose stools (**max 8 doses in 24  hours**).     [provider]  magnesium hydroxide (MILK OF MAGNESIA) 400 MG/5ML suspension Take 30 mLs by mouth at bedtime as needed for mild constipation.    [provider]  memantine (NAMENDA) 10 MG tablet Take 10 mg by mouth in the morning and at bedtime.    [provider]  neomycin-bacitracin-polymyxin (NEOSPORIN) [phone] ointment Apply 1 application topically daily as needed (minor skin tears/abrasions).    [provider]    Allergies    Patient has no known allergies.  Review of Systems   Review of Systems  Unable to perform ROS: Mental status change   Physical Exam Updated Vital Signs BP 115/70    Pulse (!) 52    Temp 98.6 F (37 C)    Resp 16    SpO2 100%   Physical Exam Vitals and nursing note reviewed.  70 year old male, resting comfortably and in no acute distress. Vital signs are significant for slightly slow heart rate. Oxygen saturation is 100%, which is normal. Head is normocephalic and atraumatic. PERRLA, EOMI. Oropharynx is clear. Neck is nontender and supple without adenopathy or JVD. Back is nontender and there is no CVA tenderness. Lungs are clear without rales, wheezes, or rhonchi. Chest is nontender. Heart has regular rate and rhythm without murmur. Abdomen is soft, flat, nontender. Extremities have no cyanosis or edema, full range of  motion is present. Skin is warm and dry without rash. Neurologic: Sleepy but arousable, oriented to person but not place or time, cranial nerves are intact, moves all extremities equally.  ED Results / Procedures / Treatments   Labs (all labs ordered are listed, but only abnormal results are displayed) Labs Reviewed  COMPREHENSIVE METABOLIC PANEL - Abnormal; Notable for the following components:      Result Value   Glucose, Bld 102 (*)    All other components within normal limits  CBC WITH DIFFERENTIAL/PLATELET - Abnormal; Notable for the following components:   WBC 3.9 (*)    RBC 3.35 (*)     Hemoglobin 11.6 (*)    HCT 33.2 (*)    MCH 34.6 (*)    All other components within normal limits  URINALYSIS, ROUTINE W REFLEX MICROSCOPIC - Abnormal; Notable for the following components:   Hgb urine dipstick MODERATE (*)    All other components within normal limits    EKG RONAK, DUQUETTE WS:[id number] 18-Jul-2021 03:33:55 [facility] ROUTINE RECORD February 20, 1951 (70 yr) Male Black Room:ED020C Loc:0 Technician: [id number] Test ind: Vent. rate 50 BPM PR interval 144 ms QRS duration 121 ms QT/QTcB 450/411 ms P-R-T axes 46 48 77 Sinus rhythm Nonspecific intraventricular conduction delay No old tracing to compare Confirmed by Dione Booze ([id number]) on 07/18/2021 4:10:18 AM Confirmed By: Dione Booze  Radiology CT Head Wo Contrast  Result Date: 07/18/2021 CLINICAL DATA:  Altered mental status. EXAM: CT HEAD WITHOUT CONTRAST TECHNIQUE: Contiguous axial images were obtained from the base of the skull through the vertex without intravenous contrast. COMPARISON:  July 02, 2021 FINDINGS: Brain: There is mild cerebral atrophy with widening of the extra-axial spaces and ventricular dilatation. There are areas of decreased attenuation within the white matter tracts of the supratentorial brain, consistent with microvascular disease changes. Vascular: No hyperdense vessel or unexpected calcification. Skull: Normal. Negative for fracture or focal lesion. Sinuses/Orbits: A 2.6 cm x 1.7 cm left maxillary sinus polyp versus mucous retention cyst is seen. Other: None. IMPRESSION: 1. Generalized cerebral atrophy. 2. No acute intracranial abnormality. 3. Left maxillary sinus polyp versus mucous retention cyst. Electronically Signed   By: Aram Candela M.D.   On: 07/18/2021 04:53   DG Chest Port 1 View  Result Date: 07/18/2021 CLINICAL DATA:  Altered mental status. EXAM: PORTABLE CHEST 1 VIEW COMPARISON:  Chest radiograph dated 07/02/2021. FINDINGS: Faint right infrahilar density, likely  atelectasis. Developing infiltrate is less likely. No focal consolidation, pleural effusion, pneumothorax. The cardiac silhouette is within normal limits. No acute osseous pathology. IMPRESSION: Right infrahilar atelectasis. Developing infiltrate is less likely. Electronically Signed   By: Elgie Collard M.D.   On: 07/18/2021 03:54    Procedures Procedures   Medications Ordered in ED Medications - No data to display  ED Course  I have reviewed the triage vital signs and the nursing notes.  Pertinent labs & imaging results that were available during my care of the patient were reviewed by me and considered in my medical decision making (see chart for details).   MDM Rules/Calculators/A&P                         Agitation and patient with history of dementia.  We will check screening labs to rule out serious pathology, but suspect he will just need additional sedatives at his skilled nursing facility.  Old records are reviewed, and he has a recent ED visit for hallucinations and another  ED visit for falls.  Chest x-ray shows area of atelectasis but no infiltrate.  CT of head shows no acute process.  Labs show stable anemia, otherwise unremarkable.  Urinalysis shows no evidence of infection.  ECG shows sinus bradycardia.  Patient is reevaluated and is awake and alert and is not agitated at all.  I suspect that he would do well with ongoing prescription for haloperidol.  He is discharged back to his skilled nursing facility with prescription for haloperidol.  Final Clinical Impression(s) / ED Diagnoses Final diagnoses:  Agitation due to dementia  Normocytic anemia    Rx / DC Orders ED Discharge Orders          Ordered    haloperidol (HALDOL) 5 MG tablet  Every 6 hours PRN        07/18/21 0527             Dione Booze, MD 07/18/21 [phone]

## 2021-07-19 DIAGNOSIS — F0280 Dementia in other diseases classified elsewhere without behavioral disturbance: Secondary | ICD-10-CM | POA: Diagnosis not present

## 2021-07-19 DIAGNOSIS — R2689 Other abnormalities of gait and mobility: Secondary | ICD-10-CM | POA: Diagnosis not present

## 2021-07-19 DIAGNOSIS — M6281 Muscle weakness (generalized): Secondary | ICD-10-CM | POA: Diagnosis not present

## 2021-07-19 DIAGNOSIS — R2681 Unsteadiness on feet: Secondary | ICD-10-CM | POA: Diagnosis not present

## 2021-07-19 DIAGNOSIS — F060 Psychotic disorder with hallucinations due to known physiological condition: Secondary | ICD-10-CM | POA: Diagnosis not present

## 2021-07-19 DIAGNOSIS — I10 Essential (primary) hypertension: Secondary | ICD-10-CM | POA: Diagnosis not present

## 2021-07-20 DIAGNOSIS — I10 Essential (primary) hypertension: Secondary | ICD-10-CM | POA: Diagnosis not present

## 2021-07-20 DIAGNOSIS — F0280 Dementia in other diseases classified elsewhere without behavioral disturbance: Secondary | ICD-10-CM | POA: Diagnosis not present

## 2021-07-20 DIAGNOSIS — M6281 Muscle weakness (generalized): Secondary | ICD-10-CM | POA: Diagnosis not present

## 2021-07-20 DIAGNOSIS — R2689 Other abnormalities of gait and mobility: Secondary | ICD-10-CM | POA: Diagnosis not present

## 2021-07-20 DIAGNOSIS — F060 Psychotic disorder with hallucinations due to known physiological condition: Secondary | ICD-10-CM | POA: Diagnosis not present

## 2021-07-20 DIAGNOSIS — R2681 Unsteadiness on feet: Secondary | ICD-10-CM | POA: Diagnosis not present

## 2021-07-23 DIAGNOSIS — F060 Psychotic disorder with hallucinations due to known physiological condition: Secondary | ICD-10-CM | POA: Diagnosis not present

## 2021-07-23 DIAGNOSIS — I10 Essential (primary) hypertension: Secondary | ICD-10-CM | POA: Diagnosis not present

## 2021-07-23 DIAGNOSIS — F0280 Dementia in other diseases classified elsewhere without behavioral disturbance: Secondary | ICD-10-CM | POA: Diagnosis not present

## 2021-07-23 DIAGNOSIS — M6281 Muscle weakness (generalized): Secondary | ICD-10-CM | POA: Diagnosis not present

## 2021-07-24 DIAGNOSIS — M6281 Muscle weakness (generalized): Secondary | ICD-10-CM | POA: Diagnosis not present

## 2021-07-24 DIAGNOSIS — R2681 Unsteadiness on feet: Secondary | ICD-10-CM | POA: Diagnosis not present

## 2021-07-24 DIAGNOSIS — R2689 Other abnormalities of gait and mobility: Secondary | ICD-10-CM | POA: Diagnosis not present

## 2021-07-24 DIAGNOSIS — F0280 Dementia in other diseases classified elsewhere without behavioral disturbance: Secondary | ICD-10-CM | POA: Diagnosis not present

## 2021-07-24 DIAGNOSIS — R4586 Emotional lability: Secondary | ICD-10-CM | POA: Diagnosis not present

## 2021-07-24 DIAGNOSIS — F03B11 Unspecified dementia, moderate, with agitation: Secondary | ICD-10-CM | POA: Diagnosis not present

## 2021-07-24 DIAGNOSIS — G301 Alzheimer's disease with late onset: Secondary | ICD-10-CM | POA: Diagnosis not present

## 2021-07-24 DIAGNOSIS — R609 Edema, unspecified: Secondary | ICD-10-CM | POA: Diagnosis not present

## 2021-07-24 DIAGNOSIS — F060 Psychotic disorder with hallucinations due to known physiological condition: Secondary | ICD-10-CM | POA: Diagnosis not present

## 2021-07-24 DIAGNOSIS — I10 Essential (primary) hypertension: Secondary | ICD-10-CM | POA: Diagnosis not present

## 2021-07-25 DIAGNOSIS — F0280 Dementia in other diseases classified elsewhere without behavioral disturbance: Secondary | ICD-10-CM | POA: Diagnosis not present

## 2021-07-25 DIAGNOSIS — I10 Essential (primary) hypertension: Secondary | ICD-10-CM | POA: Diagnosis not present

## 2021-07-25 DIAGNOSIS — R2681 Unsteadiness on feet: Secondary | ICD-10-CM | POA: Diagnosis not present

## 2021-07-25 DIAGNOSIS — F060 Psychotic disorder with hallucinations due to known physiological condition: Secondary | ICD-10-CM | POA: Diagnosis not present

## 2021-07-25 DIAGNOSIS — M6281 Muscle weakness (generalized): Secondary | ICD-10-CM | POA: Diagnosis not present

## 2021-07-25 DIAGNOSIS — R2689 Other abnormalities of gait and mobility: Secondary | ICD-10-CM | POA: Diagnosis not present

## 2021-07-26 DIAGNOSIS — F060 Psychotic disorder with hallucinations due to known physiological condition: Secondary | ICD-10-CM | POA: Diagnosis not present

## 2021-07-26 DIAGNOSIS — M6281 Muscle weakness (generalized): Secondary | ICD-10-CM | POA: Diagnosis not present

## 2021-07-26 DIAGNOSIS — R2689 Other abnormalities of gait and mobility: Secondary | ICD-10-CM | POA: Diagnosis not present

## 2021-07-26 DIAGNOSIS — F0280 Dementia in other diseases classified elsewhere without behavioral disturbance: Secondary | ICD-10-CM | POA: Diagnosis not present

## 2021-07-26 DIAGNOSIS — I10 Essential (primary) hypertension: Secondary | ICD-10-CM | POA: Diagnosis not present

## 2021-07-26 DIAGNOSIS — R2681 Unsteadiness on feet: Secondary | ICD-10-CM | POA: Diagnosis not present

## 2021-07-27 DIAGNOSIS — F0280 Dementia in other diseases classified elsewhere without behavioral disturbance: Secondary | ICD-10-CM | POA: Diagnosis not present

## 2021-07-27 DIAGNOSIS — R2681 Unsteadiness on feet: Secondary | ICD-10-CM | POA: Diagnosis not present

## 2021-07-27 DIAGNOSIS — R2689 Other abnormalities of gait and mobility: Secondary | ICD-10-CM | POA: Diagnosis not present

## 2021-07-27 DIAGNOSIS — F060 Psychotic disorder with hallucinations due to known physiological condition: Secondary | ICD-10-CM | POA: Diagnosis not present

## 2021-07-27 DIAGNOSIS — M6281 Muscle weakness (generalized): Secondary | ICD-10-CM | POA: Diagnosis not present

## 2021-07-27 DIAGNOSIS — I10 Essential (primary) hypertension: Secondary | ICD-10-CM | POA: Diagnosis not present

## 2021-07-30 DIAGNOSIS — R2689 Other abnormalities of gait and mobility: Secondary | ICD-10-CM | POA: Diagnosis not present

## 2021-07-30 DIAGNOSIS — R2681 Unsteadiness on feet: Secondary | ICD-10-CM | POA: Diagnosis not present

## 2021-07-30 DIAGNOSIS — F0280 Dementia in other diseases classified elsewhere without behavioral disturbance: Secondary | ICD-10-CM | POA: Diagnosis not present

## 2021-07-30 DIAGNOSIS — I10 Essential (primary) hypertension: Secondary | ICD-10-CM | POA: Diagnosis not present

## 2021-07-30 DIAGNOSIS — M6281 Muscle weakness (generalized): Secondary | ICD-10-CM | POA: Diagnosis not present

## 2021-07-30 DIAGNOSIS — F060 Psychotic disorder with hallucinations due to known physiological condition: Secondary | ICD-10-CM | POA: Diagnosis not present

## 2021-07-31 DIAGNOSIS — R2689 Other abnormalities of gait and mobility: Secondary | ICD-10-CM | POA: Diagnosis not present

## 2021-07-31 DIAGNOSIS — F0280 Dementia in other diseases classified elsewhere without behavioral disturbance: Secondary | ICD-10-CM | POA: Diagnosis not present

## 2021-07-31 DIAGNOSIS — I10 Essential (primary) hypertension: Secondary | ICD-10-CM | POA: Diagnosis not present

## 2021-07-31 DIAGNOSIS — M6281 Muscle weakness (generalized): Secondary | ICD-10-CM | POA: Diagnosis not present

## 2021-07-31 DIAGNOSIS — F060 Psychotic disorder with hallucinations due to known physiological condition: Secondary | ICD-10-CM | POA: Diagnosis not present

## 2021-07-31 DIAGNOSIS — R2681 Unsteadiness on feet: Secondary | ICD-10-CM | POA: Diagnosis not present

## 2021-08-01 DIAGNOSIS — R2689 Other abnormalities of gait and mobility: Secondary | ICD-10-CM | POA: Diagnosis not present

## 2021-08-01 DIAGNOSIS — R2681 Unsteadiness on feet: Secondary | ICD-10-CM | POA: Diagnosis not present

## 2021-08-01 DIAGNOSIS — M6281 Muscle weakness (generalized): Secondary | ICD-10-CM | POA: Diagnosis not present

## 2021-08-01 DIAGNOSIS — F060 Psychotic disorder with hallucinations due to known physiological condition: Secondary | ICD-10-CM | POA: Diagnosis not present

## 2021-08-01 DIAGNOSIS — E119 Type 2 diabetes mellitus without complications: Secondary | ICD-10-CM | POA: Diagnosis not present

## 2021-08-01 DIAGNOSIS — I10 Essential (primary) hypertension: Secondary | ICD-10-CM | POA: Diagnosis not present

## 2021-08-01 DIAGNOSIS — F0280 Dementia in other diseases classified elsewhere without behavioral disturbance: Secondary | ICD-10-CM | POA: Diagnosis not present

## 2021-08-02 ENCOUNTER — Encounter (HOSPITAL_COMMUNITY): Payer: Self-pay

## 2021-08-02 ENCOUNTER — Other Ambulatory Visit: Payer: Self-pay

## 2021-08-02 ENCOUNTER — Emergency Department (HOSPITAL_COMMUNITY)
Admission: EM | Admit: 2021-08-02 | Discharge: 2021-08-03 | Disposition: A | Discharge status: Skilled Nursing Facility | Payer: Medicare Other | Attending: Emergency Medicine | Admitting: Emergency Medicine

## 2021-08-02 DIAGNOSIS — M6281 Muscle weakness (generalized): Secondary | ICD-10-CM | POA: Diagnosis not present

## 2021-08-02 DIAGNOSIS — Z743 Need for continuous supervision: Secondary | ICD-10-CM | POA: Diagnosis not present

## 2021-08-02 DIAGNOSIS — R7401 Elevation of levels of liver transaminase levels: Secondary | ICD-10-CM | POA: Insufficient documentation

## 2021-08-02 DIAGNOSIS — R4189 Other symptoms and signs involving cognitive functions and awareness: Secondary | ICD-10-CM

## 2021-08-02 DIAGNOSIS — F989 Unspecified behavioral and emotional disorders with onset usually occurring in childhood and adolescence: Secondary | ICD-10-CM | POA: Insufficient documentation

## 2021-08-02 DIAGNOSIS — F29 Unspecified psychosis not due to a substance or known physiological condition: Secondary | ICD-10-CM | POA: Diagnosis not present

## 2021-08-02 DIAGNOSIS — R2681 Unsteadiness on feet: Secondary | ICD-10-CM | POA: Diagnosis not present

## 2021-08-02 DIAGNOSIS — R2689 Other abnormalities of gait and mobility: Secondary | ICD-10-CM | POA: Diagnosis not present

## 2021-08-02 DIAGNOSIS — F0390 Unspecified dementia without behavioral disturbance: Secondary | ICD-10-CM | POA: Diagnosis not present

## 2021-08-02 DIAGNOSIS — D649 Anemia, unspecified: Secondary | ICD-10-CM | POA: Insufficient documentation

## 2021-08-02 DIAGNOSIS — I10 Essential (primary) hypertension: Secondary | ICD-10-CM | POA: Diagnosis not present

## 2021-08-02 DIAGNOSIS — F060 Psychotic disorder with hallucinations due to known physiological condition: Secondary | ICD-10-CM | POA: Diagnosis not present

## 2021-08-02 DIAGNOSIS — R4182 Altered mental status, unspecified: Secondary | ICD-10-CM | POA: Diagnosis present

## 2021-08-02 DIAGNOSIS — F0280 Dementia in other diseases classified elsewhere without behavioral disturbance: Secondary | ICD-10-CM | POA: Diagnosis not present

## 2021-08-02 NOTE — ED Triage Notes (Addendum)
PER EMS: pt from [facility], staff called EMS because he was having an episode of sundowning in which he was acting violent towards staff. Pt at baseline mentation at this time. No complaints. 132/70, HR-72, 96% RA

## 2021-08-02 NOTE — ED Provider Notes (Addendum)
Emergency Department Provider Note   I have reviewed the triage vital signs and the nursing notes.   HISTORY  Chief Complaint Dementia   HPI Walter [name] is a 71 y.o. male with PMH of dementia presents to the ED from [facility] with episode of violence at the nursing facility. EMS report "sundowning" and "aggressive" behavior which lasted briefly. He has returned to his mental status baseline prior to arrival in the ED.   Level 5 caveat: Dementia    Past Medical History:  Diagnosis Date   Dementia ([place])    Hallucinations     Review of Systems  Level 5 caveat: Dementia   ____________________________________________   PHYSICAL EXAM:  VITAL SIGNS: ED Triage Vitals  Enc Vitals Group     BP 08/02/21 2302 136/74     Pulse Rate 08/02/21 2302 72     Resp 08/02/21 2302 18     Temp 08/02/21 2302 98.4 F (36.9 C)     Temp Source 08/02/21 2302 Oral     SpO2 08/02/21 2302 100 %     Weight 08/02/21 2303 227 lb (103 kg)     Height 08/02/21 2303 6\' 6"  (1.981 m)   Constitutional: Awakens easily to voice. Calm and cooperative.  Eyes: Conjunctivae are normal. PERRL.  Head: Atraumatic. Nose: No congestion/rhinnorhea. Mouth/Throat: Mucous membranes are moist.  Neck: No stridor.   Cardiovascular: Normal rate, regular rhythm. Good peripheral circulation. Grossly normal heart sounds.   Respiratory: Normal respiratory effort.  No retractions. Lungs CTAB. Gastrointestinal: Soft and nontender. No distention.  Musculoskeletal: No gross deformities of extremities. Neurologic:  Normal speech and language. No gross focal neurologic deficits are appreciated.  Skin:  Skin is warm, dry and intact. No rash noted.   ____________________________________________   LABS (all labs ordered are listed, but only abnormal results are displayed)  Labs Reviewed  COMPREHENSIVE METABOLIC PANEL - Abnormal; Notable for the following components:      Result Value   Sodium 134 (*)    BUN  25 (*)    Calcium 8.7 (*)    AST 74 (*)    ALT 50 (*)    Alkaline Phosphatase 36 (*)    All other components within normal limits  CBC WITH DIFFERENTIAL/PLATELET - Abnormal; Notable for the following components:   RBC 3.01 (*)    Hemoglobin 10.5 (*)    HCT 29.1 (*)    MCH 34.9 (*)    MCHC 36.1 (*)    All other components within normal limits  URINALYSIS, ROUTINE W REFLEX MICROSCOPIC - Abnormal; Notable for the following components:   Hgb urine dipstick SMALL (*)    Ketones, ur 5 (*)    All other components within normal limits  URINE CULTURE    ____________________________________________   PROCEDURES  Procedure(s) performed:   Procedures  None  ____________________________________________   INITIAL IMPRESSION / ASSESSMENT AND PLAN / ED COURSE  Pertinent labs & imaging results that were available during my care of the patient were reviewed by me and considered in my medical decision making (see chart for details).   This patient is Presenting for Evaluation of agitation, which does require a range of treatment options, and is a complaint that involves a high risk of morbidity and mortality.  The Differential Diagnoses includes but is not exclusive to alcohol, illicit or prescription medications, intracranial pathology such as stroke, intracerebral hemorrhage, fever or infectious causes including sepsis, hypoxemia, uremia, trauma, endocrine related disorders such as diabetes, hypoglycemia, thyroid-related  diseases, etc.  I did Additional Historical Information from EMS who provide most of the history as above.   I decided to review pertinent External Data, and in summary patient seen in the ED often after falls but occasionally after agitation episodes.    Clinical Laboratory Tests Ordered, included CBC and CMP. No leukocytosis. Mild anemia. Normal platelets. No AKI. Normal electrolytes. Very mild elevation in LFTs with normal bilirubin. No abdominal tenderness.    Radiologic Tests Considered: Considered chest and CT imaging of the head. Ultimately, patient is at mental status baseline per EMS and after chart review. No focal neuro deficits. No outward sign of trauma. Lungs are CTABL. No fever or hypoxemia. Defer imaging for now.   Medical Decision Making: Summary: Patient with brief episode of agitation and aggression this evening. No outward sign of trauma. No focal neuro deficit to suspect CVA. Calm and cooperative here. No safety concerns at this time. Do not see an indication for psychiatry evaluation at this time. Plan for continue home medication and can return to the SNF.   Disposition: Discharge.    03:10 AM  After discharge and while awaiting [facility] for transport, I was made aware that the patient has become more agitated and confused. He is not threatening but confused and difficult to re-direct. He did require home medications (Haldol and Ativan) PRN for agitation and brief restraint. No change to disposition.   ____________________________________________  FINAL CLINICAL IMPRESSION(S) / ED DIAGNOSES  Final diagnoses:  Cognitive and behavioral changes    Note:  This document was prepared using Dragon voice recognition software and may include unintentional dictation errors.  Nanda Quinton, MD, [facility] Emergency Medicine    Chevy Virgo, Wonda Old[employer], MD 08/03/21 ID:[id number]    Margette Fast, MD 08/03/21 [id number]

## 2021-08-03 DIAGNOSIS — R279 Unspecified lack of coordination: Secondary | ICD-10-CM | POA: Diagnosis not present

## 2021-08-03 DIAGNOSIS — Z743 Need for continuous supervision: Secondary | ICD-10-CM | POA: Diagnosis not present

## 2021-08-03 DIAGNOSIS — R2689 Other abnormalities of gait and mobility: Secondary | ICD-10-CM | POA: Diagnosis not present

## 2021-08-03 DIAGNOSIS — I10 Essential (primary) hypertension: Secondary | ICD-10-CM | POA: Diagnosis not present

## 2021-08-03 DIAGNOSIS — M6281 Muscle weakness (generalized): Secondary | ICD-10-CM | POA: Diagnosis not present

## 2021-08-03 DIAGNOSIS — R456 Violent behavior: Secondary | ICD-10-CM | POA: Diagnosis not present

## 2021-08-03 DIAGNOSIS — F0390 Unspecified dementia without behavioral disturbance: Secondary | ICD-10-CM | POA: Diagnosis not present

## 2021-08-03 DIAGNOSIS — F0280 Dementia in other diseases classified elsewhere without behavioral disturbance: Secondary | ICD-10-CM | POA: Diagnosis not present

## 2021-08-03 DIAGNOSIS — R2681 Unsteadiness on feet: Secondary | ICD-10-CM | POA: Diagnosis not present

## 2021-08-03 DIAGNOSIS — F060 Psychotic disorder with hallucinations due to known physiological condition: Secondary | ICD-10-CM | POA: Diagnosis not present

## 2021-08-03 LAB — URINALYSIS, ROUTINE W REFLEX MICROSCOPIC
Bacteria, UA: NONE SEEN
Bilirubin Urine: NEGATIVE
Glucose, UA: NEGATIVE mg/dL
Ketones, ur: 5 mg/dL — AB
Leukocytes,Ua: NEGATIVE
Nitrite: NEGATIVE
Protein, ur: NEGATIVE mg/dL
Specific Gravity, Urine: 1.026 (ref 1.005–1.030)
pH: 5 (ref 5.0–8.0)

## 2021-08-03 LAB — COMPREHENSIVE METABOLIC PANEL
ALT: 50 U/L — ABNORMAL HIGH (ref 0–44)
AST: 74 U/L — ABNORMAL HIGH (ref 15–41)
Albumin: 3.9 g/dL (ref 3.5–5.0)
Alkaline Phosphatase: 36 U/L — ABNORMAL LOW (ref 38–126)
Anion gap: 9 (ref 5–15)
BUN: 25 mg/dL — ABNORMAL HIGH (ref 8–23)
CO2: 24 mmol/L (ref 22–32)
Calcium: 8.7 mg/dL — ABNORMAL LOW (ref 8.9–10.3)
Chloride: 101 mmol/L (ref 98–111)
Creatinine, Ser: 1 mg/dL (ref 0.61–1.24)
GFR, Estimated: >60 mL/min (ref >60)
Glucose, Bld: 94 mg/dL (ref 70–99)
Potassium: 3.7 mmol/L (ref 3.5–5.1)
Sodium: 134 mmol/L — ABNORMAL LOW (ref 135–145)
Total Bilirubin: 0.9 mg/dL (ref 0.3–1.2)
Total Protein: 7.1 g/dL (ref 6.5–8.1)

## 2021-08-03 LAB — CBC WITH DIFFERENTIAL/PLATELET
Abs Immature Granulocytes: 0.01 10*3/uL (ref 0.00–0.07)
Basophils Absolute: 0 10*3/uL (ref 0.0–0.1)
Basophils Relative: 1 %
Eosinophils Absolute: 0.1 10*3/uL (ref 0.0–0.5)
Eosinophils Relative: 1 %
HCT: 29.1 % — ABNORMAL LOW (ref 39.0–52.0)
Hemoglobin: 10.5 g/dL — ABNORMAL LOW (ref 13.0–17.0)
Immature Granulocytes: 0 %
Lymphocytes Relative: 23 %
Lymphs Abs: 1 10*3/uL (ref 0.7–4.0)
MCH: 34.9 pg — ABNORMAL HIGH (ref 26.0–34.0)
MCHC: 36.1 g/dL — ABNORMAL HIGH (ref 30.0–36.0)
MCV: 96.7 fL (ref 80.0–100.0)
Monocytes Absolute: 0.7 10*3/uL (ref 0.1–1.0)
Monocytes Relative: 16 %
Neutro Abs: 2.5 10*3/uL (ref 1.7–7.7)
Neutrophils Relative %: 59 %
Platelets: 183 10*3/uL (ref 150–400)
RBC: 3.01 MIL/uL — ABNORMAL LOW (ref 4.22–5.81)
RDW: 12.8 % (ref 11.5–15.5)
WBC: 4.2 10*3/uL (ref 4.0–10.5)
nRBC: 0 % (ref 0.0–0.2)

## 2021-08-03 MED ORDER — HALOPERIDOL LACTATE 5 MG/ML IJ SOLN
INTRAMUSCULAR | Status: AC
  Administered 2021-08-03: 2 mg via INTRAVENOUS
  Filled 2021-08-03: qty 1

## 2021-08-03 MED ORDER — HALOPERIDOL LACTATE 5 MG/ML IJ SOLN: 2.0000 mg | Freq: Once | INTRAMUSCULAR | Status: AC

## 2021-08-03 MED ORDER — LORAZEPAM 2 MG/ML IJ SOLN
INTRAMUSCULAR | Status: AC
  Administered 2021-08-03: 1 mg via INTRAVENOUS
  Filled 2021-08-03: qty 1

## 2021-08-03 MED ORDER — HALOPERIDOL LACTATE 5 MG/ML IJ SOLN
5.0000 mg | Freq: Once | INTRAMUSCULAR | Status: AC
  Administered 2021-08-03: 5 mg via INTRAVENOUS
  Filled 2021-08-03: qty 1

## 2021-08-03 MED ORDER — LORAZEPAM 2 MG/ML IJ SOLN: 1.0000 mg | Freq: Once | INTRAMUSCULAR | Status: AC

## 2021-08-03 NOTE — ED Notes (Signed)
Patient extremely combative, punching, kicking, pinching, and biting.  Dr. Jacqulyn Bath at bedside and new orders received.

## 2021-08-03 NOTE — ED Notes (Signed)
Pt attempted to get out of bed, was found sitting backwards on the stretcher despite the bed alarm being on. When staff attempted to place him back into his bed he became combative. Kicking, pinching, punching at staff. Mittens placed on him but he manages to get them off. Dr. Jacqulyn Bath informed. See MAR for orders. Pt continues to punch staff whenever he is touched.

## 2021-08-03 NOTE — ED Notes (Signed)
[facility] called for transport.  

## 2021-08-03 NOTE — ED Notes (Signed)
[facility] arrived to transport pt back to care [facility]. Pt transported via stretcher w/ [facility] staff.  Pt cooperative at this time.  Discharge instructions sent w/ [facility] EMS.  ED RN attempted to call [employer] x3 no answer.  All belongings sent w/ [facility] EMS and pt back to care facility.  No belongings left in room upon dc.

## 2021-08-03 NOTE — Discharge Instructions (Signed)
Your labs here did not suggest a medical reason for your symptoms. Please continue to follow with your medical team as an outpatient for further medication adjustment as needed.

## 2021-08-04 LAB — URINE CULTURE: Culture: NO GROWTH

## 2021-08-06 DIAGNOSIS — R2681 Unsteadiness on feet: Secondary | ICD-10-CM | POA: Diagnosis not present

## 2021-08-06 DIAGNOSIS — I10 Essential (primary) hypertension: Secondary | ICD-10-CM | POA: Diagnosis not present

## 2021-08-06 DIAGNOSIS — R2689 Other abnormalities of gait and mobility: Secondary | ICD-10-CM | POA: Diagnosis not present

## 2021-08-06 DIAGNOSIS — M6281 Muscle weakness (generalized): Secondary | ICD-10-CM | POA: Diagnosis not present

## 2021-08-06 DIAGNOSIS — F060 Psychotic disorder with hallucinations due to known physiological condition: Secondary | ICD-10-CM | POA: Diagnosis not present

## 2021-08-06 DIAGNOSIS — F0280 Dementia in other diseases classified elsewhere without behavioral disturbance: Secondary | ICD-10-CM | POA: Diagnosis not present

## 2021-08-07 DIAGNOSIS — I10 Essential (primary) hypertension: Secondary | ICD-10-CM | POA: Diagnosis not present

## 2021-08-07 DIAGNOSIS — M6281 Muscle weakness (generalized): Secondary | ICD-10-CM | POA: Diagnosis not present

## 2021-08-07 DIAGNOSIS — G301 Alzheimer's disease with late onset: Secondary | ICD-10-CM | POA: Diagnosis not present

## 2021-08-07 DIAGNOSIS — F0280 Dementia in other diseases classified elsewhere without behavioral disturbance: Secondary | ICD-10-CM | POA: Diagnosis not present

## 2021-08-07 DIAGNOSIS — R2681 Unsteadiness on feet: Secondary | ICD-10-CM | POA: Diagnosis not present

## 2021-08-07 DIAGNOSIS — F03B11 Unspecified dementia, moderate, with agitation: Secondary | ICD-10-CM | POA: Diagnosis not present

## 2021-08-07 DIAGNOSIS — F060 Psychotic disorder with hallucinations due to known physiological condition: Secondary | ICD-10-CM | POA: Diagnosis not present

## 2021-08-07 DIAGNOSIS — R2689 Other abnormalities of gait and mobility: Secondary | ICD-10-CM | POA: Diagnosis not present

## 2021-08-08 DIAGNOSIS — F0280 Dementia in other diseases classified elsewhere without behavioral disturbance: Secondary | ICD-10-CM | POA: Diagnosis not present

## 2021-08-08 DIAGNOSIS — I10 Essential (primary) hypertension: Secondary | ICD-10-CM | POA: Diagnosis not present

## 2021-08-08 DIAGNOSIS — F060 Psychotic disorder with hallucinations due to known physiological condition: Secondary | ICD-10-CM | POA: Diagnosis not present

## 2021-08-08 DIAGNOSIS — M6281 Muscle weakness (generalized): Secondary | ICD-10-CM | POA: Diagnosis not present

## 2021-08-09 DIAGNOSIS — M6281 Muscle weakness (generalized): Secondary | ICD-10-CM | POA: Diagnosis not present

## 2021-08-09 DIAGNOSIS — F0280 Dementia in other diseases classified elsewhere without behavioral disturbance: Secondary | ICD-10-CM | POA: Diagnosis not present

## 2021-08-09 DIAGNOSIS — I10 Essential (primary) hypertension: Secondary | ICD-10-CM | POA: Diagnosis not present

## 2021-08-09 DIAGNOSIS — F060 Psychotic disorder with hallucinations due to known physiological condition: Secondary | ICD-10-CM | POA: Diagnosis not present

## 2021-08-10 DIAGNOSIS — F0280 Dementia in other diseases classified elsewhere without behavioral disturbance: Secondary | ICD-10-CM | POA: Diagnosis not present

## 2021-08-10 DIAGNOSIS — F060 Psychotic disorder with hallucinations due to known physiological condition: Secondary | ICD-10-CM | POA: Diagnosis not present

## 2021-08-10 DIAGNOSIS — I10 Essential (primary) hypertension: Secondary | ICD-10-CM | POA: Diagnosis not present

## 2021-08-10 DIAGNOSIS — M6281 Muscle weakness (generalized): Secondary | ICD-10-CM | POA: Diagnosis not present

## 2021-08-13 DIAGNOSIS — I10 Essential (primary) hypertension: Secondary | ICD-10-CM | POA: Diagnosis not present

## 2021-08-13 DIAGNOSIS — R2681 Unsteadiness on feet: Secondary | ICD-10-CM | POA: Diagnosis not present

## 2021-08-13 DIAGNOSIS — F060 Psychotic disorder with hallucinations due to known physiological condition: Secondary | ICD-10-CM | POA: Diagnosis not present

## 2021-08-13 DIAGNOSIS — F0280 Dementia in other diseases classified elsewhere without behavioral disturbance: Secondary | ICD-10-CM | POA: Diagnosis not present

## 2021-08-13 DIAGNOSIS — R2689 Other abnormalities of gait and mobility: Secondary | ICD-10-CM | POA: Diagnosis not present

## 2021-08-13 DIAGNOSIS — M6281 Muscle weakness (generalized): Secondary | ICD-10-CM | POA: Diagnosis not present

## 2021-08-15 DIAGNOSIS — I10 Essential (primary) hypertension: Secondary | ICD-10-CM | POA: Diagnosis not present

## 2021-08-15 DIAGNOSIS — R2681 Unsteadiness on feet: Secondary | ICD-10-CM | POA: Diagnosis not present

## 2021-08-15 DIAGNOSIS — F060 Psychotic disorder with hallucinations due to known physiological condition: Secondary | ICD-10-CM | POA: Diagnosis not present

## 2021-08-15 DIAGNOSIS — F0280 Dementia in other diseases classified elsewhere without behavioral disturbance: Secondary | ICD-10-CM | POA: Diagnosis not present

## 2021-08-15 DIAGNOSIS — M6281 Muscle weakness (generalized): Secondary | ICD-10-CM | POA: Diagnosis not present

## 2021-08-15 DIAGNOSIS — R2689 Other abnormalities of gait and mobility: Secondary | ICD-10-CM | POA: Diagnosis not present

## 2021-08-16 DIAGNOSIS — M6281 Muscle weakness (generalized): Secondary | ICD-10-CM | POA: Diagnosis not present

## 2021-08-16 DIAGNOSIS — F060 Psychotic disorder with hallucinations due to known physiological condition: Secondary | ICD-10-CM | POA: Diagnosis not present

## 2021-08-16 DIAGNOSIS — I10 Essential (primary) hypertension: Secondary | ICD-10-CM | POA: Diagnosis not present

## 2021-08-16 DIAGNOSIS — F0280 Dementia in other diseases classified elsewhere without behavioral disturbance: Secondary | ICD-10-CM | POA: Diagnosis not present

## 2021-08-20 DIAGNOSIS — R2681 Unsteadiness on feet: Secondary | ICD-10-CM | POA: Diagnosis not present

## 2021-08-20 DIAGNOSIS — N3946 Mixed incontinence: Secondary | ICD-10-CM | POA: Diagnosis not present

## 2021-08-20 DIAGNOSIS — F060 Psychotic disorder with hallucinations due to known physiological condition: Secondary | ICD-10-CM | POA: Diagnosis not present

## 2021-08-20 DIAGNOSIS — I10 Essential (primary) hypertension: Secondary | ICD-10-CM | POA: Diagnosis not present

## 2021-08-20 DIAGNOSIS — L22 Diaper dermatitis: Secondary | ICD-10-CM | POA: Diagnosis not present

## 2021-08-20 DIAGNOSIS — M6281 Muscle weakness (generalized): Secondary | ICD-10-CM | POA: Diagnosis not present

## 2021-08-20 DIAGNOSIS — F0280 Dementia in other diseases classified elsewhere without behavioral disturbance: Secondary | ICD-10-CM | POA: Diagnosis not present

## 2021-08-20 DIAGNOSIS — R2689 Other abnormalities of gait and mobility: Secondary | ICD-10-CM | POA: Diagnosis not present

## 2021-08-21 DIAGNOSIS — M6281 Muscle weakness (generalized): Secondary | ICD-10-CM | POA: Diagnosis not present

## 2021-08-21 DIAGNOSIS — F060 Psychotic disorder with hallucinations due to known physiological condition: Secondary | ICD-10-CM | POA: Diagnosis not present

## 2021-08-21 DIAGNOSIS — I10 Essential (primary) hypertension: Secondary | ICD-10-CM | POA: Diagnosis not present

## 2021-08-21 DIAGNOSIS — F0280 Dementia in other diseases classified elsewhere without behavioral disturbance: Secondary | ICD-10-CM | POA: Diagnosis not present

## 2021-08-22 DIAGNOSIS — I10 Essential (primary) hypertension: Secondary | ICD-10-CM | POA: Diagnosis not present

## 2021-08-22 DIAGNOSIS — F060 Psychotic disorder with hallucinations due to known physiological condition: Secondary | ICD-10-CM | POA: Diagnosis not present

## 2021-08-22 DIAGNOSIS — M6281 Muscle weakness (generalized): Secondary | ICD-10-CM | POA: Diagnosis not present

## 2021-08-22 DIAGNOSIS — F0280 Dementia in other diseases classified elsewhere without behavioral disturbance: Secondary | ICD-10-CM | POA: Diagnosis not present

## 2021-08-23 DIAGNOSIS — F060 Psychotic disorder with hallucinations due to known physiological condition: Secondary | ICD-10-CM | POA: Diagnosis not present

## 2021-08-23 DIAGNOSIS — I10 Essential (primary) hypertension: Secondary | ICD-10-CM | POA: Diagnosis not present

## 2021-08-23 DIAGNOSIS — G301 Alzheimer's disease with late onset: Secondary | ICD-10-CM | POA: Diagnosis not present

## 2021-08-23 DIAGNOSIS — F03B11 Unspecified dementia, moderate, with agitation: Secondary | ICD-10-CM | POA: Diagnosis not present

## 2021-08-23 DIAGNOSIS — F0280 Dementia in other diseases classified elsewhere without behavioral disturbance: Secondary | ICD-10-CM | POA: Diagnosis not present

## 2021-08-23 DIAGNOSIS — M6281 Muscle weakness (generalized): Secondary | ICD-10-CM | POA: Diagnosis not present

## 2021-08-24 DIAGNOSIS — F0280 Dementia in other diseases classified elsewhere without behavioral disturbance: Secondary | ICD-10-CM | POA: Diagnosis not present

## 2021-08-24 DIAGNOSIS — M6281 Muscle weakness (generalized): Secondary | ICD-10-CM | POA: Diagnosis not present

## 2021-08-24 DIAGNOSIS — I10 Essential (primary) hypertension: Secondary | ICD-10-CM | POA: Diagnosis not present

## 2021-08-24 DIAGNOSIS — F060 Psychotic disorder with hallucinations due to known physiological condition: Secondary | ICD-10-CM | POA: Diagnosis not present

## 2021-08-27 DIAGNOSIS — Z0000 Encounter for general adult medical examination without abnormal findings: Secondary | ICD-10-CM | POA: Diagnosis not present

## 2021-08-27 DIAGNOSIS — F0280 Dementia in other diseases classified elsewhere without behavioral disturbance: Secondary | ICD-10-CM | POA: Diagnosis not present

## 2021-08-27 DIAGNOSIS — F060 Psychotic disorder with hallucinations due to known physiological condition: Secondary | ICD-10-CM | POA: Diagnosis not present

## 2021-08-27 DIAGNOSIS — M6281 Muscle weakness (generalized): Secondary | ICD-10-CM | POA: Diagnosis not present

## 2021-08-27 DIAGNOSIS — Z993 Dependence on wheelchair: Secondary | ICD-10-CM | POA: Diagnosis not present

## 2021-08-27 DIAGNOSIS — N3942 Incontinence without sensory awareness: Secondary | ICD-10-CM | POA: Diagnosis not present

## 2021-08-27 DIAGNOSIS — I10 Essential (primary) hypertension: Secondary | ICD-10-CM | POA: Diagnosis not present

## 2021-08-31 DIAGNOSIS — F03C11 Unspecified dementia, severe, with agitation: Secondary | ICD-10-CM | POA: Diagnosis not present

## 2021-08-31 DIAGNOSIS — I10 Essential (primary) hypertension: Secondary | ICD-10-CM | POA: Diagnosis not present

## 2021-08-31 DIAGNOSIS — D649 Anemia, unspecified: Secondary | ICD-10-CM | POA: Diagnosis not present

## 2021-08-31 DIAGNOSIS — S31812D Laceration with foreign body of right buttock, subsequent encounter: Secondary | ICD-10-CM | POA: Diagnosis not present

## 2021-08-31 DIAGNOSIS — F209 Schizophrenia, unspecified: Secondary | ICD-10-CM | POA: Diagnosis not present

## 2021-08-31 DIAGNOSIS — S31010D Laceration without foreign body of lower back and pelvis without penetration into retroperitoneum, subsequent encounter: Secondary | ICD-10-CM | POA: Diagnosis not present

## 2021-08-31 DIAGNOSIS — S31821D Laceration without foreign body of left buttock, subsequent encounter: Secondary | ICD-10-CM | POA: Diagnosis not present

## 2021-08-31 DIAGNOSIS — F03C2 Unspecified dementia, severe, with psychotic disturbance: Secondary | ICD-10-CM | POA: Diagnosis not present

## 2021-08-31 DIAGNOSIS — F03C18 Unspecified dementia, severe, with other behavioral disturbance: Secondary | ICD-10-CM | POA: Diagnosis not present

## 2021-08-31 DIAGNOSIS — R32 Unspecified urinary incontinence: Secondary | ICD-10-CM | POA: Diagnosis not present

## 2021-09-06 ENCOUNTER — Other Ambulatory Visit: Payer: Self-pay

## 2021-09-06 ENCOUNTER — Encounter (HOSPITAL_COMMUNITY): Payer: Self-pay | Admitting: Student

## 2021-09-06 ENCOUNTER — Observation Stay (HOSPITAL_COMMUNITY): Payer: Medicare Other

## 2021-09-06 ENCOUNTER — Emergency Department (HOSPITAL_COMMUNITY): Payer: Medicare Other

## 2021-09-06 ENCOUNTER — Inpatient Hospital Stay (HOSPITAL_COMMUNITY)
Admission: EM | Admit: 2021-09-06 | Discharge: 2021-09-14 | DRG: 682 | Disposition: A | Discharge status: Hospice | Payer: Medicare Other | Attending: Internal Medicine | Admitting: Internal Medicine

## 2021-09-06 DIAGNOSIS — F29 Unspecified psychosis not due to a substance or known physiological condition: Secondary | ICD-10-CM | POA: Diagnosis not present

## 2021-09-06 DIAGNOSIS — R4182 Altered mental status, unspecified: Secondary | ICD-10-CM

## 2021-09-06 DIAGNOSIS — E872 Acidosis, unspecified: Secondary | ICD-10-CM | POA: Diagnosis not present

## 2021-09-06 DIAGNOSIS — M545 Low back pain, unspecified: Secondary | ICD-10-CM | POA: Diagnosis not present

## 2021-09-06 DIAGNOSIS — E870 Hyperosmolality and hypernatremia: Secondary | ICD-10-CM | POA: Diagnosis not present

## 2021-09-06 DIAGNOSIS — G309 Alzheimer's disease, unspecified: Secondary | ICD-10-CM | POA: Diagnosis not present

## 2021-09-06 DIAGNOSIS — E860 Dehydration: Secondary | ICD-10-CM | POA: Diagnosis not present

## 2021-09-06 DIAGNOSIS — G8929 Other chronic pain: Secondary | ICD-10-CM

## 2021-09-06 DIAGNOSIS — R404 Transient alteration of awareness: Secondary | ICD-10-CM | POA: Diagnosis not present

## 2021-09-06 DIAGNOSIS — R4020 Unspecified coma: Secondary | ICD-10-CM | POA: Diagnosis not present

## 2021-09-06 DIAGNOSIS — Z515 Encounter for palliative care: Secondary | ICD-10-CM | POA: Diagnosis not present

## 2021-09-06 DIAGNOSIS — I495 Sick sinus syndrome: Secondary | ICD-10-CM | POA: Diagnosis not present

## 2021-09-06 DIAGNOSIS — R410 Disorientation, unspecified: Secondary | ICD-10-CM | POA: Diagnosis not present

## 2021-09-06 DIAGNOSIS — E861 Hypovolemia: Secondary | ICD-10-CM | POA: Diagnosis not present

## 2021-09-06 DIAGNOSIS — D649 Anemia, unspecified: Secondary | ICD-10-CM | POA: Diagnosis not present

## 2021-09-06 DIAGNOSIS — F03C2 Unspecified dementia, severe, with psychotic disturbance: Secondary | ICD-10-CM | POA: Diagnosis not present

## 2021-09-06 DIAGNOSIS — R7881 Bacteremia: Secondary | ICD-10-CM | POA: Diagnosis present

## 2021-09-06 DIAGNOSIS — G249 Dystonia, unspecified: Secondary | ICD-10-CM | POA: Diagnosis not present

## 2021-09-06 DIAGNOSIS — N179 Acute kidney failure, unspecified: Principal | ICD-10-CM | POA: Diagnosis present

## 2021-09-06 DIAGNOSIS — R5381 Other malaise: Secondary | ICD-10-CM | POA: Diagnosis not present

## 2021-09-06 DIAGNOSIS — L89150 Pressure ulcer of sacral region, unstageable: Secondary | ICD-10-CM | POA: Diagnosis present

## 2021-09-06 DIAGNOSIS — Z7401 Bed confinement status: Secondary | ICD-10-CM

## 2021-09-06 DIAGNOSIS — Z66 Do not resuscitate: Secondary | ICD-10-CM | POA: Diagnosis present

## 2021-09-06 DIAGNOSIS — G9341 Metabolic encephalopathy: Secondary | ICD-10-CM | POA: Diagnosis not present

## 2021-09-06 DIAGNOSIS — R7989 Other specified abnormal findings of blood chemistry: Secondary | ICD-10-CM | POA: Diagnosis present

## 2021-09-06 DIAGNOSIS — I10 Essential (primary) hypertension: Secondary | ICD-10-CM | POA: Diagnosis present

## 2021-09-06 DIAGNOSIS — B9620 Unspecified Escherichia coli [E. coli] as the cause of diseases classified elsewhere: Secondary | ICD-10-CM | POA: Diagnosis present

## 2021-09-06 DIAGNOSIS — Z79899 Other long term (current) drug therapy: Secondary | ICD-10-CM | POA: Diagnosis not present

## 2021-09-06 DIAGNOSIS — M2550 Pain in unspecified joint: Secondary | ICD-10-CM | POA: Diagnosis not present

## 2021-09-06 DIAGNOSIS — F0280 Dementia in other diseases classified elsewhere without behavioral disturbance: Secondary | ICD-10-CM | POA: Diagnosis not present

## 2021-09-06 DIAGNOSIS — Z20822 Contact with and (suspected) exposure to covid-19: Secondary | ICD-10-CM | POA: Diagnosis present

## 2021-09-06 DIAGNOSIS — R972 Elevated prostate specific antigen [PSA]: Secondary | ICD-10-CM

## 2021-09-06 DIAGNOSIS — Z993 Dependence on wheelchair: Secondary | ICD-10-CM

## 2021-09-06 DIAGNOSIS — F02C2 Dementia in other diseases classified elsewhere, severe, with psychotic disturbance: Secondary | ICD-10-CM | POA: Diagnosis not present

## 2021-09-06 DIAGNOSIS — Z7189 Other specified counseling: Secondary | ICD-10-CM

## 2021-09-06 LAB — CBC WITH DIFFERENTIAL/PLATELET
Abs Immature Granulocytes: 0.02 10*3/uL (ref 0.00–0.07)
Basophils Absolute: 0 10*3/uL (ref 0.0–0.1)
Basophils Relative: 0 %
Eosinophils Absolute: 0 10*3/uL (ref 0.0–0.5)
Eosinophils Relative: 0 %
HCT: 32.4 % — ABNORMAL LOW (ref 39.0–52.0)
Hemoglobin: 10.9 g/dL — ABNORMAL LOW (ref 13.0–17.0)
Immature Granulocytes: 0 %
Lymphocytes Relative: 8 %
Lymphs Abs: 0.5 10*3/uL — ABNORMAL LOW (ref 0.7–4.0)
MCH: 34.3 pg — ABNORMAL HIGH (ref 26.0–34.0)
MCHC: 33.6 g/dL (ref 30.0–36.0)
MCV: 101.9 fL — ABNORMAL HIGH (ref 80.0–100.0)
Monocytes Absolute: 0.6 10*3/uL (ref 0.1–1.0)
Monocytes Relative: 9 %
Neutro Abs: 5.2 10*3/uL (ref 1.7–7.7)
Neutrophils Relative %: 83 %
Platelets: 246 10*3/uL (ref 150–400)
RBC: 3.18 MIL/uL — ABNORMAL LOW (ref 4.22–5.81)
RDW: 13 % (ref 11.5–15.5)
WBC: 6.4 10*3/uL (ref 4.0–10.5)
nRBC: 0 % (ref 0.0–0.2)

## 2021-09-06 LAB — TSH: TSH: 3.31 u[IU]/mL (ref 0.350–4.500)

## 2021-09-06 LAB — RENAL FUNCTION PANEL
Albumin: 2.6 g/dL — ABNORMAL LOW (ref 3.5–5.0)
Anion gap: 10 (ref 5–15)
BUN: 47 mg/dL — ABNORMAL HIGH (ref 8–23)
CO2: 27 mmol/L (ref 22–32)
Calcium: 8.9 mg/dL (ref 8.9–10.3)
Chloride: 108 mmol/L (ref 98–111)
Creatinine, Ser: 1.61 mg/dL — ABNORMAL HIGH (ref 0.61–1.24)
GFR, Estimated: 45 mL/min — ABNORMAL LOW (ref >60)
Glucose, Bld: 125 mg/dL — ABNORMAL HIGH (ref 70–99)
Phosphorus: 4.1 mg/dL (ref 2.5–4.6)
Potassium: 4.3 mmol/L (ref 3.5–5.1)
Sodium: 145 mmol/L (ref 135–145)

## 2021-09-06 LAB — CK: Total CK: 648 U/L — ABNORMAL HIGH (ref 49–397)

## 2021-09-06 LAB — RESP PANEL BY RT-PCR (FLU A&B, COVID) ARPGX2
Influenza A by PCR: NEGATIVE
Influenza B by PCR: NEGATIVE
SARS Coronavirus 2 by RT PCR: NEGATIVE

## 2021-09-06 LAB — COMPREHENSIVE METABOLIC PANEL
ALT: 39 U/L (ref 0–44)
AST: 34 U/L (ref 15–41)
Albumin: 3.1 g/dL — ABNORMAL LOW (ref 3.5–5.0)
Alkaline Phosphatase: 61 U/L (ref 38–126)
Anion gap: 10 (ref 5–15)
BUN: 46 mg/dL — ABNORMAL HIGH (ref 8–23)
CO2: 30 mmol/L (ref 22–32)
Calcium: 9.4 mg/dL (ref 8.9–10.3)
Chloride: 107 mmol/L (ref 98–111)
Creatinine, Ser: 1.83 mg/dL — ABNORMAL HIGH (ref 0.61–1.24)
GFR, Estimated: 39 mL/min — ABNORMAL LOW (ref >60)
Glucose, Bld: 127 mg/dL — ABNORMAL HIGH (ref 70–99)
Potassium: 4.4 mmol/L (ref 3.5–5.1)
Sodium: 147 mmol/L — ABNORMAL HIGH (ref 135–145)
Total Bilirubin: 0.9 mg/dL (ref 0.3–1.2)
Total Protein: 7.4 g/dL (ref 6.5–8.1)

## 2021-09-06 LAB — I-STAT VENOUS BLOOD GAS, ED
Acid-Base Excess: 5 mmol/L — ABNORMAL HIGH (ref 0.0–2.0)
Bicarbonate: 30.2 mmol/L — ABNORMAL HIGH (ref 20.0–28.0)
Calcium, Ion: 1.19 mmol/L (ref 1.15–1.40)
HCT: 31 % — ABNORMAL LOW (ref 39.0–52.0)
Hemoglobin: 10.5 g/dL — ABNORMAL LOW (ref 13.0–17.0)
O2 Saturation: 97 %
Potassium: 4.6 mmol/L (ref 3.5–5.1)
Sodium: 146 mmol/L — ABNORMAL HIGH (ref 135–145)
TCO2: 32 mmol/L (ref 22–32)
pCO2, Ven: 49.1 mmHg (ref 44–60)
pH, Ven: 7.397 (ref 7.25–7.43)
pO2, Ven: 90 mmHg — ABNORMAL HIGH (ref 32–45)

## 2021-09-06 LAB — BRAIN NATRIURETIC PEPTIDE: B Natriuretic Peptide: 28.2 pg/mL (ref 0.0–100.0)

## 2021-09-06 LAB — LACTIC ACID, PLASMA
Lactic Acid, Venous: 2.1 mmol/L (ref 0.5–1.9)
Lactic Acid, Venous: 1.7 mmol/L (ref 0.5–1.9)

## 2021-09-06 LAB — AMMONIA: Ammonia: 14 umol/L (ref 9–35)

## 2021-09-06 LAB — CBG MONITORING, ED: Glucose-Capillary: 112 mg/dL — ABNORMAL HIGH (ref 70–99)

## 2021-09-06 LAB — VALPROIC ACID LEVEL: Valproic Acid Lvl: 14 ug/mL — ABNORMAL LOW (ref 50.0–100.0)

## 2021-09-06 MED ORDER — ACETAMINOPHEN 325 MG PO TABS: 650.0000 mg | ORAL_TABLET | Freq: Four times a day (QID) | ORAL | Status: DC | PRN

## 2021-09-06 MED ORDER — DIVALPROEX SODIUM 125 MG PO CSDR
250.0000 mg | DELAYED_RELEASE_CAPSULE | Freq: Two times a day (BID) | ORAL | Status: DC
  Administered 2021-09-07 – 2021-09-13 (×10): 250 mg via ORAL
  Filled 2021-09-06 (×18): qty 2

## 2021-09-06 MED ORDER — LORAZEPAM 2 MG/ML IJ SOLN: 0.5000 mg | Freq: Four times a day (QID) | INTRAMUSCULAR | Status: DC | PRN

## 2021-09-06 MED ORDER — LACTATED RINGERS IV SOLN: INTRAVENOUS | Status: DC

## 2021-09-06 MED ORDER — ACETAMINOPHEN 650 MG RE SUPP: 650.0000 mg | Freq: Four times a day (QID) | RECTAL | Status: DC | PRN

## 2021-09-06 MED ORDER — HEPARIN SODIUM (PORCINE) 5000 UNIT/ML IJ SOLN
5000.0000 [IU] | Freq: Three times a day (TID) | INTRAMUSCULAR | Status: DC
  Administered 2021-09-06 – 2021-09-11 (×15): 5000 [IU] via SUBCUTANEOUS
  Filled 2021-09-06 (×15): qty 1

## 2021-09-06 MED ORDER — LACTATED RINGERS IV BOLUS
1000.0000 mL | Freq: Once | INTRAVENOUS | Status: AC
Start: 2021-09-06 — End: 2021-09-06
  Administered 2021-09-06: 1000 mL via INTRAVENOUS

## 2021-09-06 NOTE — Assessment & Plan Note (Signed)
Recent Labs    05/12/21 1101 07/02/21 2221 07/18/21 0340 08/02/21 2326 09/06/21 0939  BUN 13 18 20  25* 46*  CREATININE 1.19 1.48* 1.14 1.00 1.83*  Likely prerenal from poor p.o. intake in patient with severe dementia.  Received LR bolus 1 L in ED -Continue LR at 125 cc an hour -Monitor intake and output -Repeat renal panel this evening -Consider further work-up if no improvement with IV hydration.

## 2021-09-06 NOTE — Assessment & Plan Note (Signed)
Recent Labs    05/12/21 1101 05/14/21 1030 07/02/21 2221 07/02/21 2313 07/18/21 0340 08/02/21 2326 09/06/21 0939 09/06/21 1020  HGB 11.3* 13.3 11.0* 10.9* 11.6* 10.5* 10.9* 10.5*  -H&H at baseline.

## 2021-09-06 NOTE — Assessment & Plan Note (Signed)
Tylenol as needed.

## 2021-09-06 NOTE — Significant Event (Addendum)
Pt with episode of sinus arrest: 11 sec pause on EKG.  Had a second episode of 6 sec pause as well followed by bradycardia.  Shortly thereafter pt with s.tach 120, and finally NSR at 77.  1) ordering EKG, ordering trops 2) ordering Mg 3) remainder of electrolytes look okay 4) cont tele monitor 5) suspect pt most likely has some form of sick-sinus syndrome 6) likely needs goals of care discussion with family, then either to be DNR or cards consult in AM re: ? PPM placement.  Update: had another episode of sinus arrest, with agonal breathing.  Spoke with Dr. Noemi Chapel: Does NOT sound like a PPM candidate given non-verbal dementia at baseline.  She will pass on to the day team, but she can tell me they wont do PPM tonight for sure.  Also ordering 1L LR bolus for soft BPs (running 90s systolic).  Pt with severe lethargy on evaluation.

## 2021-09-06 NOTE — Assessment & Plan Note (Signed)
Normotensive. -Hold home amlodipine

## 2021-09-06 NOTE — Hospital Course (Signed)
71 year old M with history of severe dementia with psychosis, HTN and lower back pain presenting from nursing facility with unresponsive episode while sitting on wheelchair.   Patient is sleepy and wakes to voice with gentle tap on his shoulder.  He does not talk or follow command.  Per patient's daughter, he has severe dementia totally dependent for ADL's.  He was not talking or responding over the last 2 days.  He was not eating or drinking much either.  No seizure-like shaking.  Did not notice any respiratory, GI or UTI symptoms.  Denies new medication change.  In ED, vital signs within normal. Na 147. Cr 1.83 (baseline 1.0).  BUN 46.  LA 2.1.  Hgb 10.9> 10.5.  Ammonia 14.  CT head and CXR personally reviewed without acute finding.  EKG personally reviewed features NSR at 89 with artifacts.  VBG without significant finding.  Received a liter of LR bolus.  UA, blood culture and MRI brain ordered.  Hospitalist service consulted for admission for AKI, dehydration and encephalopathy.

## 2021-09-06 NOTE — H&P (Signed)
History and Physical    Patient: Walter [name] [id number] DOB: 27-Jul-1950 DOA: 09/06/2021 DOS: the patient was seen and examined on 09/06/2021 PCP: Merrilee Seashore, MD  Patient coming from:  Nursing home  Chief Complaint:  Chief Complaint  Patient presents with   Altered Mental Status    HPI:  71 year old M with history of severe dementia with psychosis, HTN and lower back pain presenting from nursing facility with unresponsive episode while sitting on wheelchair.   Patient is sleepy and wakes to voice with gentle tap on his shoulder.  He does not talk or follow command.  Per patient's daughter, he has severe dementia totally dependent for ADL's.  He was not talking or responding over the last 2 days.  He was not eating or drinking much either.  No seizure-like shaking.  Did not notice any respiratory, GI or UTI symptoms.  Denies new medication change.  In ED, vital signs within normal. Na 147. Cr 1.83 (baseline 1.0).  BUN 46.  LA 2.1.  Hgb 10.9> 10.5.  Ammonia 14.  CT head and CXR personally reviewed without acute finding.  EKG personally reviewed features NSR at 89 with artifacts.  VBG without significant finding.  Received a liter of LR bolus.  UA, blood culture and MRI brain ordered.  Hospitalist service consulted for admission for AKI, dehydration and encephalopathy.    Review of Systems: unable to review all systems due to the inability of the patient to answer questions. Past Medical History:  Diagnosis Date   Dementia ([place])    Hallucinations    History reviewed. No pertinent surgical history. Social History:  reports that he has never smoked. He has never used smokeless tobacco. He reports that he does not currently use alcohol. No history on file for drug use.  No Known Allergies  History reviewed. No pertinent family history.  Prior to Admission medications   Medication Sig Start Date End Date Taking? Authorizing Provider  acetaminophen (TYLENOL) 500 MG tablet  Take 500 mg by mouth every 6 (six) hours as needed for headache, fever or mild pain.   Yes [provider]  aluminum-magnesium hydroxide 200-200 MG/5ML suspension Take 30 mLs by mouth every 6 (six) hours as needed for indigestion (heartburn).   Yes [provider]  amLODipine (NORVASC) 5 MG tablet Take 5 mg by mouth daily. 11/17/17  Yes [provider]  divalproex (DEPAKOTE) 250 MG DR tablet Take 250 mg by mouth 2 (two) times daily. 07/24/21  Yes [provider]  guaiFENesin (ROBITUSSIN) 100 MG/5ML liquid Take 200 mg by mouth every 6 (six) hours as needed for cough.   Yes [provider]  loperamide (IMODIUM) 2 MG capsule Take 2 mg by mouth as needed for diarrhea or loose stools (**max 8 doses in 24  hours**).   Yes [provider]  LORazepam (ATIVAN) 0.5 MG tablet Take 0.5 mg by mouth every evening. 08/27/21  Yes [provider]  magnesium hydroxide (MILK OF MAGNESIA) 400 MG/5ML suspension Take 30 mLs by mouth at bedtime as needed for mild constipation.   Yes [provider]  memantine (NAMENDA) 10 MG tablet Take 10 mg by mouth 2 (two) times daily.   Yes [provider]  Menthol-Zinc Oxide (CALMOSEPTINE) 0.44-20.6 % OINT Apply 1 application topically as needed (to scrotum after each incontinence episode).   Yes [provider]  neomycin-bacitracin-polymyxin (NEOSPORIN) [phone] ointment Apply 1 application topically daily as needed (minor skin tears/abrasions).   Yes [provider]  OVER THE COUNTER MEDICATION Take 1 Bottle by mouth in the morning, at noon, and at bedtime.   Yes [provider]  paliperidone (INVEGA) 3 MG 24 hr tablet Take 3 mg by mouth daily. 07/22/21  Yes [provider]  haloperidol (HALDOL) 5 MG tablet Take 1-2 tablets (5-10 mg total) by mouth every 6 (six) hours as needed for agitation. 5 mg  in the morning and afternoon 10 mg at bedtime Patient not taking: Reported  on [id number]   Delora Fuel, MD    Physical Exam: Vitals:   09/06/21 1145 09/06/21 1215 09/06/21 1300 09/06/21 1330  BP: 106/65 106/62 116/66 109/65  Pulse: 82 80 82 83  Resp: 19 20 18 15   Temp:      TempSrc:      SpO2: 100% 99% 100% 100%  Height:       GENERAL: Frail looking.  Nontoxic. HEENT: MMM.  Vision and hearing grossly intact.  NECK: Supple.  No apparent JVD.  RESP: 100% on RA.  No IWOB.  Fair aeration bilaterally. CVS:  RRR. Heart sounds normal.  ABD/GI/GU: BS+. Abd soft, NTND.  MSK/EXT:  Moves extremities. No apparent deformity.  Chronic pedal edema bilaterally. SKIN: no apparent skin lesion or wound NEURO: Sleepy but wakes to voice with gentle stop on his shoulder.  Not oriented.  Does not follow commands.  PERRL.  No facial asymmetry.  Symmetric patellar reflex.  Seems to have some dystonia especially in his neck.  PSYCH: Calm.  No distress or agitation.  Data Reviewed: See HPI  Assessment and Plan: * AKI with azotemia- (present on admission) Recent Labs    05/12/21 1101 07/02/21 2221 07/18/21 0340 08/02/21 2326 09/06/21 0939  BUN 13 18 20  25* 46*  CREATININE 1.19 1.48* 1.14 1.00 1.83*  Likely prerenal from poor p.o. intake in patient with severe dementia.  Received LR bolus 1 L in ED -Continue LR at 125 cc an hour -Monitor intake and output -Repeat renal panel this evening -Consider further work-up if no improvement with IV hydration.   Hypernatremia Most likely hypovolemic from dehydration -Continue LR at 125 cc an hour. -Recheck renal panel this evening.  Acute metabolic encephalopathy in patient with severe dementia and psychotic disturbance- (present on admission) Patient is a sleepy but wakes to voice with gentle tap on his shoulder.  Does not talk or follows command.  No focal neurodeficit but limited exam.  He seems to be on Invega, Haldol Depakote and Ativan.  Seems to have some degree of dystonia especially in his neck.  Low suspicion  for infectious process.  CT head without acute finding. -Consulted psychiatry for assistance with antipsychotic and dystonia -Delirium precautions. -N.p.o. pending SLP eval -IV fluid hydration as above -Follow MRI brain but doubt significant finding  Normocytic anemia- (present on admission) Recent Labs    05/12/21 1101 05/14/21 1030 07/02/21 2221 07/02/21 2313 07/18/21 0340 08/02/21 2326 09/06/21 0939 09/06/21 1020  HGB 11.3* 13.3 11.0* 10.9* 11.6* 10.5* 10.9* 10.5*  -H&H at baseline.   Lactic acidosis- (present on admission) Likely due to dehydration. -IV fluid as above -Recheck  Dystonia- (present on admission) Patient is on Invega and Haldol -Psychiatry consulted.  Goals of care, counseling/discussion Talked to patient's daughter over the phone.  Discussed about CODE STATUS, pros and cons of CPR and intubation.  Given his severe dementia and frailty, I recommended DNR/DNI.  Patient's daughter defer this decision to her mother. I was unable to reach patient's wife over the phone. -  Patient's daughter to call him mother and let us know about his CODE STATUS. -For now, he remains full code  Physical deconditioning Seems he is wheelchair-bound at baseline. -PT/OT consulted  Essential hypertension- (present on admission) Normotensive. -Hold home amlodipine  Low back pain- (present on admission) Tylenol as needed       Advance Care Planning:   Code Status: Full Code   Consults: Psychiatry  Family Communication: Updated patient's daughter over the phone.  Severity of Illness: The appropriate patient status for this patient is OBSERVATION. Observation status is judged to be reasonable and necessary in order to provide the required intensity of service to ensure the patient's safety. The patient's presenting symptoms, physical exam findings, and initial radiographic and laboratory data in the context of their medical condition is felt to place them at decreased  risk for further clinical deterioration. Furthermore, it is anticipated that the patient will be medically stable for discharge from the hospital within 2 midnights of admission.   Author: Mercy Riding, MD 09/06/2021 2:04 PM  For on call review [email or web address].

## 2021-09-06 NOTE — Assessment & Plan Note (Signed)
Seems he is wheelchair-bound at baseline. -PT/OT consulted

## 2021-09-06 NOTE — Assessment & Plan Note (Signed)
Patient is on Invega and Haldol -Psychiatry consulted.

## 2021-09-06 NOTE — Progress Notes (Signed)
SLP Cancellation Note  Patient Details Name: Walter [name] MRN: [id number] DOB: Nov 13, 1950   Cancelled treatment:       Reason Eval/Treat Not Completed: Patient at procedure or test/unavailable (Pt currently off unit for MRI. SLP will follow up.)  Rivka Baune I. Hardin Negus, [place], [place] Office number [phone] Pager [place] 09/06/2021, 2:34 PM

## 2021-09-06 NOTE — Assessment & Plan Note (Signed)
Likely due to dehydration. -IV fluid as above -Recheck

## 2021-09-06 NOTE — ED Provider Notes (Signed)
[facility] EMERGENCY DEPARTMENT Provider Note   CSN: [id number] Arrival date & time: 09/06/21  0847     History  Chief Complaint  Patient presents with   Altered Mental Status    Walter [name] is a 71 y.o. male.  71 year old male presents today with family.  He has a history of severe dementia and psychosis which makes his history difficult and limited. Facility reports the patient was not responding today.  He was not following commands.  They also stated that he had increased work of breathing.  He was sent here for further evaluation.  Home Medications Prior to Admission medications   Medication Sig Start Date End Date Taking? Authorizing Provider  acetaminophen (TYLENOL) 500 MG tablet Take 500 mg by mouth every 6 (six) hours as needed for headache, fever or mild pain.   Yes [provider]  aluminum-magnesium hydroxide 200-200 MG/5ML suspension Take 30 mLs by mouth every 6 (six) hours as needed for indigestion (heartburn).   Yes [provider]  amLODipine (NORVASC) 5 MG tablet Take 5 mg by mouth daily. 11/17/17  Yes [provider]  divalproex (DEPAKOTE) 250 MG DR tablet Take 250 mg by mouth 2 (two) times daily. 07/24/21  Yes [provider]  guaiFENesin (ROBITUSSIN) 100 MG/5ML liquid Take 200 mg by mouth every 6 (six) hours as needed for cough.   Yes [provider]  loperamide (IMODIUM) 2 MG capsule Take 2 mg by mouth as needed for diarrhea or loose stools (**max 8 doses in 24  hours**).   Yes [provider]  LORazepam (ATIVAN) 0.5 MG tablet Take 0.5 mg by mouth every evening. 08/27/21  Yes [provider]  magnesium hydroxide (MILK OF MAGNESIA) 400 MG/5ML suspension Take 30 mLs by mouth at bedtime as needed for mild constipation.   Yes [provider]  memantine (NAMENDA) 10 MG tablet Take 10 mg by mouth 2 (two) times daily.   Yes [provider]  Menthol-Zinc Oxide (CALMOSEPTINE)  0.44-20.6 % OINT Apply 1 application topically as needed (to scrotum after each incontinence episode).   Yes [provider]  neomycin-bacitracin-polymyxin (NEOSPORIN) [phone] ointment Apply 1 application topically daily as needed (minor skin tears/abrasions).   Yes [provider]  OVER THE COUNTER MEDICATION Take 1 Bottle by mouth in the morning, at noon, and at bedtime.   Yes [provider]  paliperidone (INVEGA) 3 MG 24 hr tablet Take 3 mg by mouth daily. 07/22/21  Yes [provider]  haloperidol (HALDOL) 5 MG tablet Take 1-2 tablets (5-10 mg total) by mouth every 6 (six) hours as needed for agitation. 5 mg  in the morning and afternoon 10 mg at bedtime Patient not taking: Reported on [id number]   Delora Fuel, MD      Allergies    Patient has no known allergies.    Review of Systems   Review of Systems  Unable to perform ROS: Dementia   Physical Exam Updated Vital Signs BP 115/63    Pulse 78    Temp 98.5 F (36.9 C) (Axillary)    Resp 19    Ht 6\' 6"  (1.981 m)    SpO2 100%    BMI 26.23 kg/m  Physical Exam Vitals and nursing note reviewed.  Constitutional:      Appearance: He is well-developed.  HENT:     Head: Normocephalic and atraumatic.  Eyes:     Conjunctiva/sclera: Conjunctivae normal.  Cardiovascular:     Rate  and Rhythm: Normal rate and regular rhythm.     Heart sounds: No murmur heard. Pulmonary:     Effort: Pulmonary effort is normal. No respiratory distress.     Breath sounds: Normal breath sounds.  Abdominal:     Palpations: Abdomen is soft.     Tenderness: There is no abdominal tenderness.  Musculoskeletal:        General: No swelling.     Cervical back: Neck supple.     Right lower leg: Edema present.     Left lower leg: Edema present.  Skin:    General: Skin is warm and dry.     Capillary Refill: Capillary refill takes less than 2 seconds.  Neurological:     Mental Status: He is alert.     Comments:  Patient intermittently follows commands.  He squeezed hands and both feet.  Would not cooperate for cranial nerve testing or eye movements.  Pupils were reactive.  Psychiatric:        Mood and Affect: Mood normal.    ED Results / Procedures / Treatments   Labs (all labs ordered are listed, but only abnormal results are displayed) Labs Reviewed  COMPREHENSIVE METABOLIC PANEL - Abnormal; Notable for the following components:      Result Value   Sodium 147 (*)    Glucose, Bld 127 (*)    BUN 46 (*)    Creatinine, Ser 1.83 (*)    Albumin 3.1 (*)    GFR, Estimated 39 (*)    All other components within normal limits  CBC WITH DIFFERENTIAL/PLATELET - Abnormal; Notable for the following components:   RBC 3.18 (*)    Hemoglobin 10.9 (*)    HCT 32.4 (*)    MCV 101.9 (*)    MCH 34.3 (*)    Lymphs Abs 0.5 (*)    All other components within normal limits  LACTIC ACID, PLASMA - Abnormal; Notable for the following components:   Lactic Acid, Venous 2.1 (*)    All other components within normal limits  CBG MONITORING, ED - Abnormal; Notable for the following components:   Glucose-Capillary 112 (*)    All other components within normal limits  I-STAT VENOUS BLOOD GAS, ED - Abnormal; Notable for the following components:   pO2, Ven 90.0 (*)    Bicarbonate 30.2 (*)    Acid-Base Excess 5.0 (*)    Sodium 146 (*)    HCT 31.0 (*)    Hemoglobin 10.5 (*)    All other components within normal limits  RESP PANEL BY RT-PCR (FLU A&B, COVID) ARPGX2  CULTURE, BLOOD (ROUTINE X 2)  CULTURE, BLOOD (ROUTINE X 2)  AMMONIA  BRAIN NATRIURETIC PEPTIDE  URINALYSIS, ROUTINE W REFLEX MICROSCOPIC  RENAL FUNCTION PANEL  VALPROIC ACID LEVEL  TSH  CK    EKG EKG Interpretation  Date/Time:  Thursday September 06 2021 09:11:11 EST Ventricular Rate:  89 PR Interval:  121 QRS Duration: 117 QT Interval:  359 QTC Calculation: 437 R Axis:   49 Text Interpretation: Sinus rhythm Left ventricular hypertrophy  Anterior ST elevation, probably due to LVH Artifact in lead(s) I II III aVR aVL aVF V1 V2 V3 V4 V5 V6 Confirmed by Gareth Morgan [id number]) on 09/06/2021 9:11:48 AM  Radiology DG Chest 1 View  Result Date: 09/06/2021 CLINICAL DATA:  Altered mental status.  Dementia EXAM: CHEST  1 VIEW COMPARISON:  Chest radiograph dated July 18, 2021 FINDINGS: The heart size and mediastinal contours are within normal limits. Both lungs  are clear. The visualized skeletal structures are unremarkable. IMPRESSION: No active disease. Electronically Signed   By: Keane Police D.O.   On: 09/06/2021 09:30   CT HEAD WO CONTRAST  Result Date: 09/06/2021 CLINICAL DATA:  Altered mental status. EXAM: CT HEAD WITHOUT CONTRAST TECHNIQUE: Contiguous axial images were obtained from the base of the skull through the vertex without intravenous contrast. RADIATION DOSE REDUCTION: This exam was performed according to the departmental dose-optimization program which includes automated exposure control, adjustment of the mA and/or kV according to patient size and/or use of iterative reconstruction technique. COMPARISON:  CT head dated July 18, 2021. FINDINGS: Brain: No evidence of acute infarction, hemorrhage, hydrocephalus, extra-axial collection or mass lesion/mass effect. Stable mild atrophy and chronic microvascular ischemic changes. Vascular: No hyperdense vessel or unexpected calcification. Skull: Normal. Negative for fracture or focal lesion. Sinuses/Orbits: No acute finding. Other: None. IMPRESSION: 1. No acute intracranial abnormality. Electronically Signed   By: Titus Dubin M.D.   On: 09/06/2021 11:39   MR BRAIN WO CONTRAST  Result Date: 09/06/2021 CLINICAL DATA:  Unresponsiveness. History of dementia and psychosis, hypertension EXAM: MRI HEAD WITHOUT CONTRAST TECHNIQUE: Multiplanar, multiecho pulse sequences of the brain and surrounding structures were obtained without intravenous contrast. COMPARISON:  Same-day  noncontrast CT head FINDINGS: Brain: There is no evidence of acute intracranial hemorrhage, extra-axial fluid collection, or acute infarct. There is mild global parenchymal volume loss with prominence of the ventricular system and extra-axial CSF spaces. Patchy FLAIR signal abnormality in the subcortical and periventricular white matter likely reflects sequela of mild chronic white matter microangiopathy. There is no suspicious parenchymal signal abnormality. There is no mass lesion. There is no midline shift. Vascular: Normal flow voids. Skull and upper cervical spine: Normal marrow signal. Sinuses/Orbits: The paranasal sinuses are clear. The globes and orbits are unremarkable. Other: None. IMPRESSION: No acute intracranial pathology. Electronically Signed   By: Valetta Mole M.D.   On: 09/06/2021 15:02    Procedures Procedures    Medications Ordered in ED Medications  lactated ringers infusion ( Intravenous New Bag/Given 09/06/21 1336)  heparin injection 5,000 Units (has no administration in time range)  acetaminophen (TYLENOL) tablet 650 mg (has no administration in time range)    Or  acetaminophen (TYLENOL) suppository 650 mg (has no administration in time range)  lactated ringers bolus 1,000 mL (0 mLs Intravenous Stopped 09/06/21 1551)    ED Course/ Medical Decision Making/ A&P  This patient presents to the ED for concern of altered mental status, this involves an extensive number of treatment options, and is a complaint that carries with it a high risk of complications and morbidity.  The differential diagnosis includes infection, worsening dementia, dehydration. Patients presentation is complicated by their history of dementia  Additional history obtained: Additional history obtained from family who was at the bedside Records reviewed previous admission documents and Care Everywhere/External Records  Lab Tests: I Ordered, and personally interpreted labs.  The pertinent results include:   mild lactate elevation. AKI. Hypernatremia  Imaging Studies ordered: I ordered imaging studies including CT scan of head and X-ray of chest   I independently visualized and interpreted imaging which showed no acute findings I agree with the radiologist interpretation  EKG (personally reviewed and interpreted): no acute ischemia. No stemi   Medical Decision Making:  Patient is a 71 yo male here with altered mental status. Etiology of changes is unclear. Workup revealed hypernatremia, aki, and mild lactate elevation. Patient has had decreased po so dehydration is  likely contributing. Started on IVF here. Patient also with AKI. We will admit patient for AMS in setting of AKI. Discussed with admitting team who accepted for admission.  Complexity of problems addressed: Patients presentation is most consistent with  acute presentation with potential threat to life or bodily function  Disposition: After consideration of the diagnostic results and the patients response to treatment,  I feel that the patent would benefit from admission   .   Patient seen in conjunction with my attending, Dr. Billy Fischer.       Final Clinical Impression(s) / ED Diagnoses Final diagnoses:  Altered mental status, unspecified altered mental status type  Dehydration  AKI (acute kidney injury) [facility])    Rx / DC Orders ED Discharge Orders     None         Jacelyn Pi, MD 09/06/21 1551    Gareth Morgan, MD 09/07/21 [phone]

## 2021-09-06 NOTE — Assessment & Plan Note (Signed)
Talked to patient's daughter over the phone.  Discussed about CODE STATUS, pros and cons of CPR and intubation.  Given his severe dementia and frailty, I recommended DNR/DNI.  Patient's daughter defer this decision to her mother. I was unable to reach patient's wife over the phone. -Patient's daughter to call him mother and let us know about his CODE STATUS. -For now, he remains full code

## 2021-09-06 NOTE — ED Triage Notes (Signed)
BIB EMS from [facility] for pt being unresponsive in w/c. SNF is unsure if this is behavioral as pt has hx of psychosis w/behavioral disturbance. He was nonverbal w/EMS has hx of dementia. He appeared to be forcing his eyes shut and keeping his arms tight.

## 2021-09-06 NOTE — Evaluation (Addendum)
Clinical/Bedside Swallow Evaluation Patient Details  Name: Walter [name] MRN: [id number] Date of Birth: 1950/11/24  Today's Date: 09/06/2021 Time: SLP Start Time (ACUTE ONLY): 11 SLP Stop Time (ACUTE ONLY): 1539 SLP Time Calculation (min) (ACUTE ONLY): 15 min  Past Medical History:  Past Medical History:  Diagnosis Date   Dementia ([place])    Hallucinations    Past Surgical History: History reviewed. No pertinent surgical history. HPI:  Pt is a 71 year old male who presented from nursing facility with unresponsive episode while sitting on wheelchair. Pt admitted for AKI, dehydration and acute metabolic encephalopathy. CT head negative. CXR negative. PMH: severe dementia with psychosis, HTN and lower back pain.    Assessment / Plan / Recommendation  Clinical Impression  Pt was seen in the ED for bedside swallow evaluation. Verbal output was limited and pt was unable to provide any meaningful history secondary to dementia. Attempts to reach pt's wife were unsuccessful so pt's baseline swallow function is unknown. Oral mechanism exam was limited due to pt's difficulty following commands; however, dentition was reduced and in poor condition, and pt's tongue was coated; RN present and advised of this. Pt demonstrated a posterior head tilt throughout the evaluation; the impact of this on his function is considered. He tolerated puree, regular texture solids, and nectar thick liquids via straw without overt s/sx of aspiration. Pt independently consumed >7 consecutive swallows of thin liquids and demonstrated coughing thereafter. Pt appeared to better tolerate reduced amounts of thin liquids, but throat clearing was still demonstrated. He exhibited reduced bolus awareness, and mastication was inconsistent and prolonged despite prompts. No significant oral residue was noted. A dysphagia 1 diet with nectar thick liquids will be initiated at this time. SLP will follow to assess improvement and for  advancement/further evaluation as clinically indicated. SLP Visit Diagnosis: Dysphagia, unspecified (R13.10)    Aspiration Risk  Mild aspiration risk;Moderate aspiration risk    Diet Recommendation Dysphagia 1 (Puree);Nectar-thick liquid   Liquid Administration via: Cup;Straw Medication Administration: Crushed with puree Supervision: Full supervision/cueing for compensatory strategies Compensations: Slow rate;Small sips/bites Postural Changes: Seated upright at 90 degrees    Other  Recommendations Oral Care Recommendations: Oral care BID Other Recommendations: Order thickener from pharmacy    Recommendations for follow up therapy are one component of a multi-disciplinary discharge planning process, led by the attending physician.  Recommendations may be updated based on patient status, additional functional criteria and insurance authorization.  Follow up Recommendations  (TBD)      Assistance Recommended at Discharge Frequent or constant Supervision/Assistance  Functional Status Assessment  (baseline unknown)  Frequency and Duration min 2x/week  2 weeks       Prognosis Prognosis for Safe Diet Advancement: Fair Barriers to Reach Goals: Cognitive deficits      Swallow Study   General Date of Onset: 09/05/21 HPI: Pt is a 71 year old male who presented from nursing facility with unresponsive episode while sitting on wheelchair. Pt admitted for AKI, dehydration and acute metabolic encephalopathy. CT head negative. CXR negative. PMH: severe dementia with psychosis, HTN and lower back pain. Type of Study: Bedside Swallow Evaluation Previous Swallow Assessment: none Diet Prior to this Study: NPO Temperature Spikes Noted: No Respiratory Status: Room air History of Recent Intubation: No Behavior/Cognition: Alert;Requires cueing;Doesn't follow directions Oral Cavity Assessment: Within Functional Limits Oral Care Completed by SLP: No Oral Cavity - Dentition: Missing dentition;Poor  condition Self-Feeding Abilities: Total assist Patient Positioning: Upright in bed;Postural control adequate for testing Baseline Vocal Quality: Low vocal  intensity Volitional Cough: Cognitively unable to elicit Volitional Swallow: Unable to elicit    Oral/Motor/Sensory Function Overall Oral Motor/Sensory Function:  (UTA)   Ice Chips Ice chips: Impaired Presentation: Spoon Oral Phase Impairments: Poor awareness of bolus   Thin Liquid Thin Liquid: Impaired Presentation: Straw Pharyngeal  Phase Impairments: Cough - Immediate;Throat Clearing - Immediate;Throat Clearing - Delayed    Nectar Thick Nectar Thick Liquid: Within functional limits Presentation: Straw   Honey Thick Honey Thick Liquid: Not tested   Puree Puree: Impaired Presentation: Spoon Oral Phase Impairments: Poor awareness of bolus Oral Phase Functional Implications: Oral holding;Prolonged oral transit   Solid     Solid: Impaired Oral Phase Impairments: Poor awareness of bolus;Impaired mastication Oral Phase Functional Implications: Impaired mastication     Walter [name], [place], [place] Office number [phone] Pager [phone]  Horton Marshall 09/06/2021,3:56 PM

## 2021-09-06 NOTE — Assessment & Plan Note (Signed)
Most likely hypovolemic from dehydration -Continue LR at 125 cc an hour. -Recheck renal panel this evening.

## 2021-09-06 NOTE — Assessment & Plan Note (Addendum)
Patient is a sleepy but wakes to voice with gentle tap on his shoulder.  Does not talk or follows command.  No focal neurodeficit but limited exam.  He seems to be on Invega, Haldol Depakote and Ativan.  Seems to have some degree of dystonia especially in his neck.  Low suspicion for infectious process.  CT head without acute finding. -Consulted psychiatry for assistance with antipsychotic and dystonia -Delirium precautions. -N.p.o. pending SLP eval -IV fluid hydration as above -Follow MRI brain but doubt significant finding

## 2021-09-07 DIAGNOSIS — R4182 Altered mental status, unspecified: Secondary | ICD-10-CM

## 2021-09-07 DIAGNOSIS — R410 Disorientation, unspecified: Secondary | ICD-10-CM | POA: Diagnosis not present

## 2021-09-07 DIAGNOSIS — F03C2 Unspecified dementia, severe, with psychotic disturbance: Secondary | ICD-10-CM | POA: Diagnosis present

## 2021-09-07 DIAGNOSIS — R7881 Bacteremia: Secondary | ICD-10-CM | POA: Diagnosis present

## 2021-09-07 DIAGNOSIS — I495 Sick sinus syndrome: Secondary | ICD-10-CM

## 2021-09-07 DIAGNOSIS — R5381 Other malaise: Secondary | ICD-10-CM | POA: Diagnosis not present

## 2021-09-07 DIAGNOSIS — F0280 Dementia in other diseases classified elsewhere without behavioral disturbance: Secondary | ICD-10-CM | POA: Diagnosis not present

## 2021-09-07 DIAGNOSIS — Z993 Dependence on wheelchair: Secondary | ICD-10-CM | POA: Diagnosis not present

## 2021-09-07 DIAGNOSIS — L89150 Pressure ulcer of sacral region, unstageable: Secondary | ICD-10-CM | POA: Diagnosis present

## 2021-09-07 DIAGNOSIS — M2550 Pain in unspecified joint: Secondary | ICD-10-CM | POA: Diagnosis not present

## 2021-09-07 DIAGNOSIS — Z66 Do not resuscitate: Secondary | ICD-10-CM | POA: Diagnosis present

## 2021-09-07 DIAGNOSIS — E872 Acidosis, unspecified: Secondary | ICD-10-CM | POA: Diagnosis present

## 2021-09-07 DIAGNOSIS — Z7401 Bed confinement status: Secondary | ICD-10-CM | POA: Diagnosis not present

## 2021-09-07 DIAGNOSIS — E860 Dehydration: Secondary | ICD-10-CM

## 2021-09-07 DIAGNOSIS — D649 Anemia, unspecified: Secondary | ICD-10-CM | POA: Diagnosis present

## 2021-09-07 DIAGNOSIS — G9341 Metabolic encephalopathy: Secondary | ICD-10-CM | POA: Diagnosis present

## 2021-09-07 DIAGNOSIS — Z515 Encounter for palliative care: Secondary | ICD-10-CM | POA: Diagnosis not present

## 2021-09-07 DIAGNOSIS — Z7189 Other specified counseling: Secondary | ICD-10-CM | POA: Diagnosis not present

## 2021-09-07 DIAGNOSIS — E870 Hyperosmolality and hypernatremia: Secondary | ICD-10-CM | POA: Diagnosis present

## 2021-09-07 DIAGNOSIS — F02C2 Dementia in other diseases classified elsewhere, severe, with psychotic disturbance: Secondary | ICD-10-CM | POA: Diagnosis not present

## 2021-09-07 DIAGNOSIS — Z20822 Contact with and (suspected) exposure to covid-19: Secondary | ICD-10-CM | POA: Diagnosis present

## 2021-09-07 DIAGNOSIS — G309 Alzheimer's disease, unspecified: Secondary | ICD-10-CM | POA: Diagnosis not present

## 2021-09-07 DIAGNOSIS — N179 Acute kidney failure, unspecified: Secondary | ICD-10-CM | POA: Diagnosis present

## 2021-09-07 DIAGNOSIS — Z79899 Other long term (current) drug therapy: Secondary | ICD-10-CM | POA: Diagnosis not present

## 2021-09-07 DIAGNOSIS — B9620 Unspecified Escherichia coli [E. coli] as the cause of diseases classified elsewhere: Secondary | ICD-10-CM | POA: Diagnosis present

## 2021-09-07 DIAGNOSIS — E861 Hypovolemia: Secondary | ICD-10-CM | POA: Diagnosis not present

## 2021-09-07 DIAGNOSIS — M545 Low back pain, unspecified: Secondary | ICD-10-CM | POA: Diagnosis present

## 2021-09-07 DIAGNOSIS — R7989 Other specified abnormal findings of blood chemistry: Secondary | ICD-10-CM | POA: Diagnosis present

## 2021-09-07 DIAGNOSIS — I10 Essential (primary) hypertension: Secondary | ICD-10-CM | POA: Diagnosis present

## 2021-09-07 DIAGNOSIS — G249 Dystonia, unspecified: Secondary | ICD-10-CM | POA: Diagnosis present

## 2021-09-07 LAB — BLOOD CULTURE ID PANEL (REFLEXED) - BCID2

## 2021-09-07 LAB — PHOSPHORUS: Phosphorus: 3.9 mg/dL (ref 2.5–4.6)

## 2021-09-07 LAB — CBC
HCT: 25.3 % — ABNORMAL LOW (ref 39.0–52.0)
Hemoglobin: 8.5 g/dL — ABNORMAL LOW (ref 13.0–17.0)
MCH: 33.9 pg (ref 26.0–34.0)
MCHC: 33.6 g/dL (ref 30.0–36.0)
MCV: 100.8 fL — ABNORMAL HIGH (ref 80.0–100.0)
Platelets: 199 10*3/uL (ref 150–400)
RBC: 2.51 MIL/uL — ABNORMAL LOW (ref 4.22–5.81)
RDW: 13.1 % (ref 11.5–15.5)
WBC: 5.4 10*3/uL (ref 4.0–10.5)
nRBC: 0 % (ref 0.0–0.2)

## 2021-09-07 LAB — COMPREHENSIVE METABOLIC PANEL
ALT: 31 U/L (ref 0–44)
AST: 30 U/L (ref 15–41)
Albumin: 2.4 g/dL — ABNORMAL LOW (ref 3.5–5.0)
Alkaline Phosphatase: 57 U/L (ref 38–126)
Anion gap: 9 (ref 5–15)
BUN: 45 mg/dL — ABNORMAL HIGH (ref 8–23)
CO2: 27 mmol/L (ref 22–32)
Calcium: 8.7 mg/dL — ABNORMAL LOW (ref 8.9–10.3)
Chloride: 109 mmol/L (ref 98–111)
Creatinine, Ser: 1.45 mg/dL — ABNORMAL HIGH (ref 0.61–1.24)
GFR, Estimated: 51 mL/min — ABNORMAL LOW (ref >60)
Glucose, Bld: 118 mg/dL — ABNORMAL HIGH (ref 70–99)
Potassium: 4 mmol/L (ref 3.5–5.1)
Sodium: 145 mmol/L (ref 135–145)
Total Bilirubin: 1.3 mg/dL — ABNORMAL HIGH (ref 0.3–1.2)
Total Protein: 6.2 g/dL — ABNORMAL LOW (ref 6.5–8.1)

## 2021-09-07 LAB — IRON AND TIBC
Iron: 18 ug/dL — ABNORMAL LOW (ref 45–182)
Saturation Ratios: 14 % — ABNORMAL LOW (ref 17.9–39.5)
TIBC: 127 ug/dL — ABNORMAL LOW (ref 250–450)
UIBC: 109 ug/dL

## 2021-09-07 LAB — RETICULOCYTES
Immature Retic Fract: 13.7 % (ref 2.3–15.9)
RBC.: 2.51 MIL/uL — ABNORMAL LOW (ref 4.22–5.81)
Retic Count, Absolute: 38.9 10*3/uL (ref 19.0–186.0)
Retic Ct Pct: 1.6 % (ref 0.4–3.1)

## 2021-09-07 LAB — CK: Total CK: 524 U/L — ABNORMAL HIGH (ref 49–397)

## 2021-09-07 LAB — FOLATE: Folate: 3.9 ng/mL — ABNORMAL LOW (ref >5.9)

## 2021-09-07 LAB — VITAMIN B12: Vitamin B-12: 853 pg/mL (ref 180–914)

## 2021-09-07 LAB — TROPONIN I (HIGH SENSITIVITY): Troponin I (High Sensitivity): 20 ng/L — ABNORMAL HIGH (ref <18)

## 2021-09-07 LAB — FERRITIN: Ferritin: 488 ng/mL — ABNORMAL HIGH (ref 24–336)

## 2021-09-07 LAB — MAGNESIUM: Magnesium: 2.3 mg/dL (ref 1.7–2.4)

## 2021-09-07 MED ORDER — LACTATED RINGERS IV BOLUS
1000.0000 mL | Freq: Once | INTRAVENOUS | Status: AC
  Administered 2021-09-07: 1000 mL via INTRAVENOUS

## 2021-09-07 MED ORDER — CEFTRIAXONE SODIUM 2 G IJ SOLR
2.0000 g | INTRAMUSCULAR | Status: DC
  Administered 2021-09-07 – 2021-09-11 (×5): 2 g via INTRAVENOUS
  Filled 2021-09-07 (×5): qty 20

## 2021-09-07 NOTE — Evaluation (Signed)
Occupational Therapy Evaluation Patient Details Name: Walter [name] MRN: [id number] DOB: 05/06/1951 Today's Date: 09/07/2021   History of Present Illness 71 year old M presenting from nursing facility with unresponsive episode while sitting on wheelchair. Found to have AKI, dehydration and encephalopathy.PHMx:severe dementia with psychosis, HTN and lower back pain   Clinical Impression   This 71 yo male admitted with above presents to acute OT with PLOF of being total A for basic ADLs per chart (wife states since Jan of this year). Pt at his baseline level of functioning, he did attempt to interact with [place] verbally when we initiated conversation--sometimes we could understand what he said and other times we could not. No further OT needs, we will sign off.      Recommendations for follow up therapy are one component of a multi-disciplinary discharge planning process, led by the attending physician.  Recommendations may be updated based on patient status, additional functional criteria and insurance authorization.   Follow Up Recommendations  No OT follow up    Assistance Recommended at Discharge Frequent or constant Supervision/Assistance  Patient can return home with the following Two people to help with bathing/dressing/bathroom;Two people to help with walking and/or transfers;Assistance with feeding;Assistance with cooking/housework;Assist for transportation    Functional Status Assessment  Patient has had a recent decline in their functional status and/or demonstrates limited ability to make significant improvements in function in a reasonable and predictable amount of time  Equipment Recommendations  None recommended by OT       Precautions / Restrictions Precautions Precautions: Fall Restrictions Weight Bearing Restrictions: No      Mobility Bed Mobility Overal bed mobility: Needs Assistance Bed Mobility: Rolling, Supine to Sit, Sit to Supine Rolling: Total assist, +2 for  physical assistance   Supine to sit: Total assist, +2 for physical assistance Sit to supine: Total assist, +2 for physical assistance            Balance Overall balance assessment: Needs assistance Sitting-balance support: Bilateral upper extremity supported, Feet supported   Sitting balance - Comments: Pt was initially zero with posterior and right lateral lean--progressed to fair (min guard A)                                   ADL either performed or assessed with clinical judgement   ADL                                         General ADL Comments: total A     Vision   Additional Comments: tends to keep eyes closed            Pertinent Vitals/Pain Pain Assessment Pain Assessment: Faces Faces Pain Scale: No hurt     Hand Dominance  (unknown)   Extremity/Trunk Assessment Upper Extremity Assessment Upper Extremity Assessment: RUE deficits/detail;LUE deficits/detail RUE Deficits / Details: Tends to keep them bent at elbows, but also saw him straighten them all the way out as well. Can have "death" grip with both hands when he feels he needs to LUE Deficits / Details: Tends to keep them bent at elbows, but also saw him straighten them all the way out as well. Can have "death" grip with both hands when he feels he needs to           Communication Communication Communication:  Expressive difficulties   Cognition Arousal/Alertness: Awake/alert Behavior During Therapy: Flat affect Overall Cognitive Status: History of cognitive impairments - at baseline                                 General Comments: Wife reports pt became a resident of SNF in October of last year and was very mobile and "busy"--since January of this year he has been like he is now.                Home Living Family/patient expects to be discharged to:: Skilled nursing facility                                        Prior  Functioning/Environment Prior Level of Function : Needs assist  Cognitive Assist : Mobility (cognitive);ADLs (cognitive) Mobility (Cognitive):  (total A) ADLs (Cognitive):  (total A) Physical Assist : Mobility (physical);ADLs (physical) Mobility (physical): Bed mobility;Transfers ADLs (physical): Feeding;Grooming;Bathing;Dressing;Toileting;IADLs Mobility Comments: total A, bed and W/C bound ADLs Comments: total A        OT Problem List: Impaired balance (sitting and/or standing);Decreased cognition;Decreased safety awareness         OT Goals(Current goals can be found in the care plan section) Acute Rehab OT Goals Patient Stated Goal: wife: to get him to a place that takes better care of him OT Goal Formulation: With family         AM-PAC OT "6 Clicks" Daily Activity     Outcome Measure Help from another person eating meals?: Total Help from another person taking care of personal grooming?: Total Help from another person toileting, which includes using toliet, bedpan, or urinal?: Total Help from another person bathing (including washing, rinsing, drying)?: Total Help from another person to put on and taking off regular upper body clothing?: Total Help from another person to put on and taking off regular lower body clothing?: Total 6 Click Score: 6   End of Session Nurse Communication: Mobility status (cleaned pt up (wet and bowel movement), wound on bottom)  Activity Tolerance: Patient tolerated treatment well Patient left: in bed;with call bell/phone within reach;with bed alarm set;with family/visitor present  OT Visit Diagnosis: Other abnormalities of gait and mobility (R26.89);Muscle weakness (generalized) (M62.81);Other symptoms and signs involving cognitive function;Adult, failure to thrive (R62.7)                Time: [id number] OT Time Calculation (min): 36 min Charges:  OT General Charges $OT Visit: 1 Visit OT Evaluation $OT Eval Moderate Complexity: 1 Mod  [address], OTR/L Acute [employer] Pager [phone] Office [phone]    Almon Register 09/07/2021, 12:31 PM

## 2021-09-07 NOTE — Progress Notes (Signed)
PROGRESS NOTE    Walter [name]  [id number] DOB: 1951-02-07 DOA: 09/06/2021 PCP: Merrilee Seashore, MD    Brief Narrative:  71 year old M with history of severe dementia with psychosis, HTN and lower back pain presenting from nursing facility with unresponsive episode while sitting on wheelchair.    Patient is sleepy and wakes to voice with gentle tap on his shoulder.  He does not talk or follow command.  Per patient's daughter, he has severe dementia totally dependent for ADL's.  He was not talking or responding over the last 2 days.  He was not eating or drinking much either.  No seizure-like shaking.  Did not notice any respiratory, GI or UTI symptoms.  Denies new medication change.   In ED, vital signs within normal. Na 147. Cr 1.83 (baseline 1.0).  BUN 46.  LA 2.1.  Hgb 10.9> 10.5.  Ammonia 14.  CT head and CXR personally reviewed without acute finding.  EKG personally reviewed features NSR at 89 with artifacts.  VBG without significant finding.  Received a liter of LR bolus.  UA, blood culture and MRI brain ordered.  Hospitalist service consulted for admission for AKI, dehydration and encephalopathy.      2/17 overnight with SB, SP.  Consulted cardiology this a.m.  Consultants:    Procedures:   Antimicrobials:      Subjective: Pt sleeping, does not open eyes with sternal rub this am. Calm.   Objective: Vitals:   09/06/21 2225 09/06/21 2314 09/07/21 0340 09/07/21 0751  BP:  (!) 92/52 96/66 107/62  Pulse:  80 78 67  Resp:  16  16  Temp:  98.4 F (36.9 C) 98.1 F (36.7 C) 98.5 F (36.9 C)  TempSrc:  Oral  Axillary  SpO2: 100% 100% 100% 100%  Height:        Intake/Output Summary (Last 24 hours) at 09/07/2021 0901 Last data filed at 09/07/2021 0600 Gross per 24 hour  Intake 2315.17 ml  Output 500 ml  Net 1815.17 ml   There were no vitals filed for this visit.  Examination:  Calm, sleeping, not interactive with exam Anteriorly cta no w/r Reg s1/s2 no  gallop Soft benign +bs Trace edema, chronic skin changes Unable to assess neuro or mood    Data Reviewed: I have personally reviewed following labs and imaging studies  CBC: Recent Labs  Lab 09/06/21 0939 09/06/21 1020 09/07/21 0401  WBC 6.4  --  5.4  NEUTROABS 5.2  --   --   HGB 10.9* 10.5* 8.5*  HCT 32.4* 31.0* 25.3*  MCV 101.9*  --  100.8*  PLT 246  --  [id number]   Basic Metabolic Panel: Recent Labs  Lab 09/06/21 0939 09/06/21 1020 09/06/21 1724 09/07/21 0401  NA 147* 146* 145 145  K 4.4 4.6 4.3 4.0  CL 107  --  108 109  CO2 30  --  27 27  GLUCOSE 127*  --  125* 118*  BUN 46*  --  47* 45*  CREATININE 1.83*  --  1.61* 1.45*  CALCIUM 9.4  --  8.9 8.7*  MG  --   --   --  2.3  PHOS  --   --  4.1 3.9   GFR: CrCl cannot be calculated (Unknown ideal weight.). Liver Function Tests: Recent Labs  Lab 09/06/21 0939 09/06/21 1724 09/07/21 0401  AST 34  --  30  ALT 39  --  31  ALKPHOS 61  --  57  BILITOT 0.9  --  1.3*  PROT 7.4  --  6.2*  ALBUMIN 3.1* 2.6* 2.4*   No results for input(s): LIPASE, AMYLASE in the last 168 hours. Recent Labs  Lab 09/06/21 0939  AMMONIA 14   Coagulation Profile: No results for input(s): INR, PROTIME in the last 168 hours. Cardiac Enzymes: Recent Labs  Lab 09/06/21 1724 09/07/21 0401  CKTOTAL 648* 524*   BNP (last 3 results) No results for input(s): PROBNP in the last 8760 hours. HbA1C: No results for input(s): HGBA1C in the last 72 hours. CBG: Recent Labs  Lab 09/06/21 0937  GLUCAP 112*   Lipid Profile: No results for input(s): CHOL, HDL, LDLCALC, TRIG, CHOLHDL, LDLDIRECT in the last 72 hours. Thyroid Function Tests: Recent Labs    09/06/21 1724  TSH 3.310   Anemia Panel: Recent Labs    09/07/21 0401  VITAMINB12 853  FOLATE 3.9*  FERRITIN 488*  TIBC 127*  IRON 18*  RETICCTPCT 1.6   Sepsis Labs: Recent Labs  Lab 09/06/21 0939 09/06/21 1724  LATICACIDVEN 2.1* 1.7    Recent Results (from the past 240  hour(s))  Blood Cultures (routine x 2)     Status: None (Preliminary result)   Collection Time: 09/06/21  9:39 AM   Specimen: BLOOD  Result Value Ref Range Status   Specimen Description BLOOD RIGHT ANTECUBITAL  Final   Special Requests   Final    BOTTLES DRAWN AEROBIC AND ANAEROBIC Blood Culture adequate volume   Culture   Final    NO GROWTH < 24 HOURS Performed at [facility], [place] [address] [place], [place] [address]    Report Status PENDING  Incomplete  Blood Cultures (routine x 2)     Status: None (Preliminary result)   Collection Time: 09/06/21 10:41 AM   Specimen: BLOOD  Result Value Ref Range Status   Specimen Description BLOOD SITE NOT SPECIFIED  Final   Special Requests   Final    BOTTLES DRAWN AEROBIC AND ANAEROBIC Blood Culture results may not be optimal due to an excessive volume of blood received in culture bottles   Culture  Setup Time   Final    GRAM NEGATIVE RODS AEROBIC BOTTLE ONLY CRITICAL RESULT CALLED TO, READ BACK BY AND VERIFIED WITH: V BRYK,PHARMD@0615  09/07/21 [place]    Culture   Final    NO GROWTH < 24 HOURS Performed at [facility], [address] [place], [place] [address]    Report Status PENDING  Incomplete  Blood Culture ID Panel (Reflexed)     Status: Abnormal   Collection Time: 09/06/21 10:41 AM  Result Value Ref Range Status   Enterococcus faecalis NOT DETECTED NOT DETECTED Final   Enterococcus Faecium NOT DETECTED NOT DETECTED Final   Listeria monocytogenes NOT DETECTED NOT DETECTED Final   Staphylococcus species NOT DETECTED NOT DETECTED Final   Staphylococcus aureus (BCID) NOT DETECTED NOT DETECTED Final   Staphylococcus epidermidis NOT DETECTED NOT DETECTED Final   Staphylococcus lugdunensis NOT DETECTED NOT DETECTED Final   Streptococcus species NOT DETECTED NOT DETECTED Final   Streptococcus agalactiae NOT DETECTED NOT DETECTED Final   Streptococcus pneumoniae NOT DETECTED NOT DETECTED Final   Streptococcus pyogenes NOT  DETECTED NOT DETECTED Final   A.calcoaceticus-baumannii NOT DETECTED NOT DETECTED Final   Bacteroides fragilis NOT DETECTED NOT DETECTED Final   Enterobacterales DETECTED (A) NOT DETECTED Final    Comment: Enterobacterales represent a large order of gram negative bacteria, not a single organism. CRITICAL RESULT CALLED TO, READ BACK  BY AND VERIFIED WITH: V BRYK,PHARMD@0615  09/07/21 [place]    Enterobacter cloacae complex NOT DETECTED NOT DETECTED Final   Escherichia coli DETECTED (A) NOT DETECTED Final    Comment: CRITICAL RESULT CALLED TO, READ BACK BY AND VERIFIED WITH: V BRYK,PHARMD@0615  09/07/21 [place]    Klebsiella aerogenes NOT DETECTED NOT DETECTED Final   Klebsiella oxytoca NOT DETECTED NOT DETECTED Final   Klebsiella pneumoniae NOT DETECTED NOT DETECTED Final   Proteus species NOT DETECTED NOT DETECTED Final   Salmonella species NOT DETECTED NOT DETECTED Final   Serratia marcescens NOT DETECTED NOT DETECTED Final   Haemophilus influenzae NOT DETECTED NOT DETECTED Final   Neisseria meningitidis NOT DETECTED NOT DETECTED Final   Pseudomonas aeruginosa NOT DETECTED NOT DETECTED Final   Stenotrophomonas maltophilia NOT DETECTED NOT DETECTED Final   Candida albicans NOT DETECTED NOT DETECTED Final   Candida auris NOT DETECTED NOT DETECTED Final   Candida glabrata NOT DETECTED NOT DETECTED Final   Candida krusei NOT DETECTED NOT DETECTED Final   Candida parapsilosis NOT DETECTED NOT DETECTED Final   Candida tropicalis NOT DETECTED NOT DETECTED Final   Cryptococcus neoformans/gattii NOT DETECTED NOT DETECTED Final   CTX-M ESBL NOT DETECTED NOT DETECTED Final   Carbapenem resistance IMP NOT DETECTED NOT DETECTED Final   Carbapenem resistance KPC NOT DETECTED NOT DETECTED Final   Carbapenem resistance NDM NOT DETECTED NOT DETECTED Final   Carbapenem resist OXA 48 LIKE NOT DETECTED NOT DETECTED Final   Carbapenem resistance VIM NOT DETECTED NOT DETECTED Final    Comment: Performed at [facility], [address] [place], [place] [address]  Resp Panel by RT-PCR (Flu A&B, Covid) Nasopharyngeal Swab     Status: None   Collection Time: 09/06/21 11:59 AM   Specimen: Nasopharyngeal Swab; Nasopharyngeal(NP) swabs in vial transport medium  Result Value Ref Range Status   SARS Coronavirus 2 by RT PCR NEGATIVE NEGATIVE Final    Comment: (NOTE) SARS-CoV-2 target nucleic acids are NOT DETECTED.  The SARS-CoV-2 RNA is generally detectable in upper respiratory specimens during the acute phase of infection. The lowest concentration of SARS-CoV-2 viral copies this assay can detect is 138 copies/mL. A negative result does not preclude SARS-Cov-2 infection and should not be used as the sole basis for treatment or other patient management decisions. A negative result may occur with  improper specimen collection/handling, submission of specimen other than nasopharyngeal swab, presence of viral mutation(s) within the areas targeted by this assay, and inadequate number of viral copies(<138 copies/mL). A negative result must be combined with clinical observations, patient history, and epidemiological information. The expected result is Negative.  Fact Sheet for Patients:  EntrepreneurPulse.com.au  Fact Sheet for Healthcare Providers:  IncredibleEmployment.be  This test is no t yet approved or cleared by the [place] FDA and  has been authorized for detection and/or diagnosis of SARS-CoV-2 by FDA under an Emergency Use Authorization (EUA). This EUA will remain  in effect (meaning this test can be used) for the duration of the COVID-19 declaration under Section 564(b)(1) of the Act, 21 U.S.C.section 360bbb-3(b)(1), unless the authorization is terminated  or revoked sooner.       Influenza A by PCR NEGATIVE NEGATIVE Final   Influenza B by PCR NEGATIVE NEGATIVE Final    Comment: (NOTE) The Xpert Xpress SARS-CoV-2/FLU/RSV plus assay is  intended as an aid in the diagnosis of influenza from Nasopharyngeal swab specimens and should not be used as a sole basis for treatment. Nasal washings and  aspirates are unacceptable for Xpert Xpress SARS-CoV-2/FLU/RSV testing.  Fact Sheet for Patients: EntrepreneurPulse.com.au  Fact Sheet for Healthcare Providers: IncredibleEmployment.be  This test is not yet approved or cleared by the [place] FDA and has been authorized for detection and/or diagnosis of SARS-CoV-2 by FDA under an Emergency Use Authorization (EUA). This EUA will remain in effect (meaning this test can be used) for the duration of the COVID-19 declaration under Section 564(b)(1) of the Act, 21 U.S.C. section 360bbb-3(b)(1), unless the authorization is terminated or revoked.  Performed at [facility], [place] [address] [place], [place] [address]          Radiology Studies: DG Chest 1 View  Result Date: 09/06/2021 CLINICAL DATA:  Altered mental status.  Dementia EXAM: CHEST  1 VIEW COMPARISON:  Chest radiograph dated July 18, 2021 FINDINGS: The heart size and mediastinal contours are within normal limits. Both lungs are clear. The visualized skeletal structures are unremarkable. IMPRESSION: No active disease. Electronically Signed   By: Keane Police D.O.   On: 09/06/2021 09:30   CT HEAD WO CONTRAST  Result Date: 09/06/2021 CLINICAL DATA:  Altered mental status. EXAM: CT HEAD WITHOUT CONTRAST TECHNIQUE: Contiguous axial images were obtained from the base of the skull through the vertex without intravenous contrast. RADIATION DOSE REDUCTION: This exam was performed according to the departmental dose-optimization program which includes automated exposure control, adjustment of the mA and/or kV according to patient size and/or use of iterative reconstruction technique. COMPARISON:  CT head dated July 18, 2021. FINDINGS: Brain: No evidence of acute infarction,  hemorrhage, hydrocephalus, extra-axial collection or mass lesion/mass effect. Stable mild atrophy and chronic microvascular ischemic changes. Vascular: No hyperdense vessel or unexpected calcification. Skull: Normal. Negative for fracture or focal lesion. Sinuses/Orbits: No acute finding. Other: None. IMPRESSION: 1. No acute intracranial abnormality. Electronically Signed   By: Titus Dubin M.D.   On: 09/06/2021 11:39   MR BRAIN WO CONTRAST  Result Date: 09/06/2021 CLINICAL DATA:  Unresponsiveness. History of dementia and psychosis, hypertension EXAM: MRI HEAD WITHOUT CONTRAST TECHNIQUE: Multiplanar, multiecho pulse sequences of the brain and surrounding structures were obtained without intravenous contrast. COMPARISON:  Same-day noncontrast CT head FINDINGS: Brain: There is no evidence of acute intracranial hemorrhage, extra-axial fluid collection, or acute infarct. There is mild global parenchymal volume loss with prominence of the ventricular system and extra-axial CSF spaces. Patchy FLAIR signal abnormality in the subcortical and periventricular white matter likely reflects sequela of mild chronic white matter microangiopathy. There is no suspicious parenchymal signal abnormality. There is no mass lesion. There is no midline shift. Vascular: Normal flow voids. Skull and upper cervical spine: Normal marrow signal. Sinuses/Orbits: The paranasal sinuses are clear. The globes and orbits are unremarkable. Other: None. IMPRESSION: No acute intracranial pathology. Electronically Signed   By: Valetta Mole M.D.   On: 09/06/2021 15:02        Scheduled Meds:  divalproex  250 mg Oral BID   heparin  5,000 Units Subcutaneous Q8H   Continuous Infusions:  cefTRIAXone (ROCEPHIN)  IV 2 g (09/07/21 0849)   lactated ringers 125 mL/hr at 09/06/21 1816    Assessment & Plan:   Principal Problem:   AKI with azotemia Active Problems:   Low back pain   Essential hypertension   Acute metabolic encephalopathy  in patient with severe dementia and psychotic disturbance   Lactic acidosis   Normocytic anemia   Goals of care, counseling/discussion   Dystonia   Hypernatremia   Physical deconditioning  AKI with Azotemia Prerenal. Improving with IVF 1.83>>1.45   Hypernatremia Most likely hypovolemic from dehydration 2/17 improved with IV fluids   Acute metabolic encephalopathy in patient with severe dementia and psychotic disturbance- (present on admission) Possibly due to electrolyte/renal derangement Correcting the above Psych consulted input appreciated Continue Ativan 0.5 mg every 6 hours as needed for agitation Hold all antipsychotics Continue Depakote to 50 mg twice daily     Normocytic anemia H/h stable     Lactic acidosis- (present on admission) Likely due to dehydration. Improved with ivf   Dystonia- (present on admission) Patient is on Invega and Haldol -Psychiatry consulted.see above   Sinus pause Now in SR Cards consulted, input appreciated Pt not great candidate for ppm    Physical deconditioning Seems he is wheelchair-bound at baseline. -PT/OT consulted   Essential hypertension- (present on admission) Normotensive. -Hold home amlodipine   Low back pain- (present on admission) Tylenol as needed           DVT prophylaxis: heparin  Code Status:full Family Communication: none at bedside Disposition Plan:  Status is: inpatient The patient will require care spanning > 2 midnights and should be moved to inpatient because: IV treatment, bacteremia, rhythm issues.              LOS: 0 days   Time spent: 35 min    Nolberto Hanlon, MD [facility] Pager [phone]  If 7PM-7AM, please contact night-coverage 09/07/2021, 9:01 AM

## 2021-09-07 NOTE — Consult Note (Signed)
Consultation Note Date: 09/07/2021   Patient Name: Walter [name]  DOB: May 17, 1951  MRN: [id number]  Age / Sex: 71 y.o., male  PCP: Walter Fick, MD Referring Physician: Lynn Ito, MD  Reason for Consultation: goals of care  HPI/Patient Profile: 71 y.o. male  with past medical history of dementia with behavioral issues, HTN admitted on 09/06/2021 with altered mental status. Workup reveals acute kidney injury, dehydration, one blood culture positive for E. Coli, MRI head negative for acute findings. He was also noted to have intermittent pauses with concerns for sick sinus syndrome- he is not considered a candidate for pacemaker placement. Palliative medicine consulted for GOC.   Primary Decision Maker NEXT OF KIN - spouse- Walter [name]  Discussion: I have reviewed medical records including EPIC notes, labs and imaging, assessed the patient and spoke via phone  with his wife, Walter [name].  to discuss diagnosis prognosis, GOC, EOL wishes, disposition and options.  As far as functional and nutritional status- he has had very rapid decline since his admission to the SNF in November. Walter [name] shares he has not been receiving adequate care there, not being fed his meals or offered hydration. This has contributed to his decline. He is now bedbound, not able to feed himself, dependent for ADL's.     We discussed patient's current illness and what it means in the larger context of patient's on-going co-morbidities.  Natural disease trajectory and expectations at EOL were discussed.  I attempted to elicit values and goals of care important to the patient. Walter [name] and Walter [name] had discussions regarding not wanting to lose their dignities.   The difference between aggressive medical intervention and comfort care was considered in light of the patient's goals of care.   Advance directives, concepts specific to code  status, artificial feeding and hydration, and rehospitalization were considered and discussed.  Discussed with patient/family the importance of continued conversation with family and the medical providers regarding overall plan of care and treatment options, ensuring decisions are within the context of the patients values and GOCs.    Questions and concerns were addressed. The family was encouraged to call with questions or concerns.      SUMMARY OF RECOMMENDATIONS -Cardiac pauses in the setting of progressing dementia- Walter [name] agrees that pacemaker is not indicated for Walter [name] agrees with placement of DNR order- Walter [name] would not want to be resuscitated if he suffered cardiac or respiratory arrest -GOC-  continue current interventions with IV fluids, IV antibiotics- Walter [name] is hopeful for some improvement in his mental state- agrees to reevaluate his status on Sunday  Code Status/Advance Care Planning: DNR   Prognosis:   < 6 months  Discharge Planning: To Be Determined  Primary Diagnoses: Present on Admission:  AKI with azotemia  (Resolved) Severe dementia with psychotic disturbance  Acute metabolic encephalopathy in patient with severe dementia and psychotic disturbance  Lactic acidosis  Essential hypertension  Low back pain  Normocytic anemia  Dystonia   Review of Systems  Physical Exam  Vital Signs: BP 107/62  Pulse 67    Temp 98.5 F (36.9 C) (Axillary)    Resp 16    Ht 6\' 6"  (1.981 m)    SpO2 100%    BMI 26.23 kg/m  Pain Scale: PAINAD       SpO2: SpO2: 100 % O2 Device:SpO2: 100 % O2 Flow Rate: .O2 Flow Rate (L/min): 0.5 L/min  IO: Intake/output summary:  Intake/Output Summary (Last 24 hours) at 09/07/2021 1434 Last data filed at 09/07/2021 0900 Gross per 24 hour  Intake 2315.17 ml  Output 500 ml  Net 1815.17 ml    LBM: Last BM Date :  (utd) Baseline Weight:   Most recent weight:       Palliative Assessment/Data: PPS: 20%       Thank  you for this consult. Palliative medicine will continue to follow and assist as needed.    Signed by: 09/09/2021, AGNP-C Palliative Medicine    Please contact Palliative Medicine Team phone at [phone] for questions and concerns.  For individual provider: See [phone]

## 2021-09-07 NOTE — TOC Progression Note (Signed)
Transition of Care [facility]) - Initial/Assessment Note    Patient Details  Name: Walter [name] MRN: [id number] Date of Birth: 1950-12-01  [facility] CM/SW Contact:    Ralene Bathe, LCSWA Phone Number: 09/07/2021, 12:11 PM  Clinical Narrative:                 CSW spoke with admissions at [facility] [phone]).  The patient is ST at the facility in the memory care unit.  An FL2 and discharge summary will be needed prior to discharge.  The information needs to be faxed to [phone].        Patient Goals and CMS Choice        Expected Discharge Plan and Services                                                Prior Living Arrangements/Services                       Activities of Daily Living Home Assistive Devices/Equipment: Wheelchair ADL Screening (condition at time of admission) Patient's cognitive ability adequate to safely complete daily activities?: No Is the patient deaf or have difficulty hearing?: No Does the patient have difficulty seeing, even when wearing glasses/contacts?: No Does the patient have difficulty concentrating, remembering, or making decisions?: Yes Patient able to express need for assistance with ADLs?: No Does the patient have difficulty dressing or bathing?: Yes Independently performs ADLs?: No Communication: Needs assistance Is this a change from baseline?: Pre-admission baseline Dressing (OT): Dependent Is this a change from baseline?: Pre-admission baseline Grooming: Dependent Is this a change from baseline?: Pre-admission baseline Feeding: Dependent Is this a change from baseline?: Pre-admission baseline Bathing: Dependent Is this a change from baseline?: Pre-admission baseline Toileting: Dependent In/Out Bed: Dependent Walks in Home: Dependent Does the patient have difficulty walking or climbing stairs?: Yes Weakness of Legs: Both Weakness of Arms/Hands: Both  Permission Sought/Granted                   Emotional Assessment              Admission diagnosis:  Dehydration [E86.0] AKI (acute kidney injury) (HCC) [N17.9] Altered mental status, unspecified altered mental status type [R41.82] Patient Active Problem List   Diagnosis Date Noted   Low back pain 09/06/2021   Essential hypertension 09/06/2021   Elevated PSA 09/06/2021   AKI with azotemia 09/06/2021   Acute metabolic encephalopathy in patient with severe dementia and psychotic disturbance 09/06/2021   Lactic acidosis 09/06/2021   Normocytic anemia 09/06/2021   Goals of care, counseling/discussion 09/06/2021   Dystonia 09/06/2021   Hypernatremia 09/06/2021   Physical deconditioning 09/06/2021   PCP:  Georgianne Fick, MD Pharmacy:  No Pharmacies Listed    Social Determinants of Health (SDOH) Interventions    Readmission Risk Interventions No flowsheet data found.

## 2021-09-07 NOTE — Progress Notes (Signed)
°   09/07/21 1850  Assess: MEWS Score  Temp 98.4 F (36.9 C)  BP 110/67  Pulse Rate 81  Resp 18  Assess: MEWS Score  MEWS Temp 0  MEWS Systolic 0  MEWS Pulse 0  MEWS RR 0  MEWS LOC 1  MEWS Score 1  MEWS Score Color Green  Assess: if the MEWS score is Yellow or Red  Were vital signs taken at a resting state? Yes  Focused Assessment No change from prior assessment  Early Detection of Sepsis Score *See Row Information* Low  MEWS guidelines implemented *See Row Information* Yes  Treat  MEWS Interventions Other (Comment) (notified md)  Breathing 0  Negative Vocalization 0  Facial Expression 0  Body Language 0  Consolability 0  PAINAD Score 0

## 2021-09-07 NOTE — Consult Note (Signed)
Cardiology Consultation:   Patient ID: Walter [name] MRN: [id number]; DOB: 11-10-1950  Admit date: 09/06/2021 Date of Consult: 09/07/2021  PCP:  Walter Seashore, MD   [facility] Providers Cardiologist:  None        Patient Profile:   Walter [name] is a 71 y.o. male with a hx of severe dementia with psychosis, hypertension who lives in a nursing facility who is being seen 09/07/2021 for the evaluation of sinus pauses overnight at the request of Dr Nolberto Hanlon.  History of Present Illness:   Walter [name] was seen in his room on the medical floor unfortunately he does not participate in the conversation.  He opens his eyes to touch and voice but is not following commands.  Most of the information in the HPI has been described from the medical chart.  The patient presented on September 06, 2021 from the nursing facility due to unresponsiveness he was sitting in a wheelchair.  Per the notes at the time of his sensation he also only open his eyes to voice and gentle tap on the shoulders.  He did not follow commands.  Per the notes the patient daughter who was present noted that he had significant dementia and was totally dependent for his ADLs.  And for few days he had not been responding.  It also noted that the patient had not had anything to eat as well.  While evaluated in the ED noted to have very increased creatinine, baseline hemoglobin was stable, ammonia 14.  He was admitted to the hospitalist service.  Overnight the patient had 11-second pause while sleeping.  Cardiology has been consulted to evaluate the patient.   Past Medical History:  Diagnosis Date   Dementia ([place])    Hallucinations     History reviewed. No pertinent surgical history.     Inpatient Medications: Scheduled Meds:  divalproex  250 mg Oral BID   heparin  5,000 Units Subcutaneous Q8H   Continuous Infusions:  cefTRIAXone (ROCEPHIN)  IV 2 g (09/07/21 0849)   lactated ringers 125 mL/hr at 09/07/21  1257   PRN Meds: acetaminophen **OR** acetaminophen, LORazepam  Allergies:   No Known Allergies  Social History:   Social History   Socioeconomic History   Marital status: Single    Spouse name: Not on file   Number of children: Not on file   Years of education: Not on file   Highest education level: Not on file  Occupational History   Not on file  Tobacco Use   Smoking status: Never   Smokeless tobacco: Never  Substance and Sexual Activity   Alcohol use: Not Currently   Drug use: Not on file   Sexual activity: Not on file  Other Topics Concern   Not on file  Social History Narrative   Not on file   Social Determinants of Health   Financial Resource Strain: Not on file  Food Insecurity: Not on file  Transportation Needs: Not on file  Physical Activity: Not on file  Stress: Not on file  Social Connections: Not on file  Intimate Partner Violence: Not on file    Family History:   History reviewed. No pertinent family history.   ROS:  Unable to obtain review of systems  Physical Exam/Data:   Vitals:   09/06/21 2225 09/06/21 2314 09/07/21 0340 09/07/21 0751  BP:  (!) 92/52 96/66 107/62  Pulse:  80 78 67  Resp:  16  16  Temp:  98.4 F (36.9 C)  98.1 F (36.7 C) 98.5 F (36.9 C)  TempSrc:  Oral  Axillary  SpO2: 100% 100% 100% 100%  Height:        Intake/Output Summary (Last 24 hours) at 09/07/2021 1425 Last data filed at 09/07/2021 0900 Gross per 24 hour  Intake 2315.17 ml  Output 500 ml  Net 1815.17 ml   Last 3 Weights 08/02/2021 07/02/2021  Weight (lbs) 227 lb 227 lb  Weight (kg) 102.967 kg 102.967 kg     Body mass index is 26.23 kg/m.  General:  Well nourished, well developed, in no acute distress HEENT: normal Neck: no JVD Vascular: No carotid bruits; Distal pulses 2+ bilaterally Cardiac:  normal S1, S2; RRR; no murmur  Lungs:  clear to auscultation bilaterally, no wheezing, rhonchi or rales  Abd: soft, nontender, no hepatomegaly  Ext: no  edema Musculoskeletal:  No deformities, BUE and BLE strength normal and equal Skin: warm and dry  Neuro:  CNs 2-12 intact, no focal abnormalities noted Psych:  Normal affect   EKG:  The EKG was personally reviewed and demonstrates: Sinus rhythm, heart rate 67 beats minute. Telemetry:  Telemetry was personally reviewed and demonstrates: Sinus rhythm, I was able to appreciate overnight pulses  Relevant CV Studies: None  Laboratory Data:  High Sensitivity Troponin:   Recent Labs  Lab 09/07/21 0401  TROPONINIHS 20*     Chemistry Recent Labs  Lab 09/06/21 0939 09/06/21 1020 09/06/21 1724 09/07/21 0401  NA 147* 146* 145 145  K 4.4 4.6 4.3 4.0  CL 107  --  108 109  CO2 30  --  27 27  GLUCOSE 127*  --  125* 118*  BUN 46*  --  47* 45*  CREATININE 1.83*  --  1.61* 1.45*  CALCIUM 9.4  --  8.9 8.7*  MG  --   --   --  2.3  GFRNONAA 39*  --  45* 51*  ANIONGAP 10  --  10 9    Recent Labs  Lab 09/06/21 0939 09/06/21 1724 09/07/21 0401  PROT 7.4  --  6.2*  ALBUMIN 3.1* 2.6* 2.4*  AST 34  --  30  ALT 39  --  31  ALKPHOS 61  --  57  BILITOT 0.9  --  1.3*   Lipids No results for input(s): CHOL, TRIG, HDL, LABVLDL, LDLCALC, CHOLHDL in the last 168 hours.  Hematology Recent Labs  Lab 09/06/21 0939 09/06/21 1020 09/07/21 0401  WBC 6.4  --  5.4  RBC 3.18*  --  2.51*   2.51*  HGB 10.9* 10.5* 8.5*  HCT 32.4* 31.0* 25.3*  MCV 101.9*  --  100.8*  MCH 34.3*  --  33.9  MCHC 33.6  --  33.6  RDW 13.0  --  13.1  PLT 246  --  199   Thyroid  Recent Labs  Lab 09/06/21 1724  TSH 3.310    BNP Recent Labs  Lab 09/06/21 0939  BNP 28.2    DDimer No results for input(s): DDIMER in the last 168 hours.   Radiology/Studies:  DG Chest 1 View  Result Date: 09/06/2021 CLINICAL DATA:  Altered mental status.  Dementia EXAM: CHEST  1 VIEW COMPARISON:  Chest radiograph dated July 18, 2021 FINDINGS: The heart size and mediastinal contours are within normal limits. Both lungs are  clear. The visualized skeletal structures are unremarkable. IMPRESSION: No active disease. Electronically Signed   By: Keane Police D.O.   On: 09/06/2021 09:30   CT HEAD WO  CONTRAST  Result Date: 09/06/2021 CLINICAL DATA:  Altered mental status. EXAM: CT HEAD WITHOUT CONTRAST TECHNIQUE: Contiguous axial images were obtained from the base of the skull through the vertex without intravenous contrast. RADIATION DOSE REDUCTION: This exam was performed according to the departmental dose-optimization program which includes automated exposure control, adjustment of the mA and/or kV according to patient size and/or use of iterative reconstruction technique. COMPARISON:  CT head dated July 18, 2021. FINDINGS: Brain: No evidence of acute infarction, hemorrhage, hydrocephalus, extra-axial collection or mass lesion/mass effect. Stable mild atrophy and chronic microvascular ischemic changes. Vascular: No hyperdense vessel or unexpected calcification. Skull: Normal. Negative for fracture or focal lesion. Sinuses/Orbits: No acute finding. Other: None. IMPRESSION: 1. No acute intracranial abnormality. Electronically Signed   By: Titus Dubin M.D.   On: 09/06/2021 11:39   MR BRAIN WO CONTRAST  Result Date: 09/06/2021 CLINICAL DATA:  Unresponsiveness. History of dementia and psychosis, hypertension EXAM: MRI HEAD WITHOUT CONTRAST TECHNIQUE: Multiplanar, multiecho pulse sequences of the brain and surrounding structures were obtained without intravenous contrast. COMPARISON:  Same-day noncontrast CT head FINDINGS: Brain: There is no evidence of acute intracranial hemorrhage, extra-axial fluid collection, or acute infarct. There is mild global parenchymal volume loss with prominence of the ventricular system and extra-axial CSF spaces. Patchy FLAIR signal abnormality in the subcortical and periventricular white matter likely reflects sequela of mild chronic white matter microangiopathy. There is no suspicious parenchymal  signal abnormality. There is no mass lesion. There is no midline shift. Vascular: Normal flow voids. Skull and upper cervical spine: Normal marrow signal. Sinuses/Orbits: The paranasal sinuses are clear. The globes and orbits are unremarkable. Other: None. IMPRESSION: No acute intracranial pathology. Electronically Signed   By: Valetta Mole M.D.   On: 09/06/2021 15:02     Assessment and Plan:   Sinus pause -currently sinus rhythm, I was able to review the telemetry as well as all of the patient's records.  He has noted pauses happen overnight while asleep suspected that this could be in the setting of undiagnosed and untreated obstructive sleep apnea.  In the meantime also the patient with a significant pause given his severe dementia as well as psychosis would not be a great candidate for permanent pacemaker implantation.  He will be at high risk for regulars syndrome leading to bradycardia or accidental device rotation which could cause sniffing and retraction/failure for device to work and also stimulation and injury to noncardiac structures including diaphragm or pectoral muscle.    I was able to speak to the patient's wife Mrs. Sora Urgiles [phone]) and his daughter Zayne Gnagey [phone]) both about evaluation of the patient.  Also explained to them as described above.  All of their questions have been answered they are both in agreement with the above.    Risk Assessment/Risk Scores:        [facility] will sign off.   Medication Recommendations:  None Other recommendations (labs, testing, etc):  None  Follow up as an outpatient:  as needed  For questions or updates, please contact [facility] Please consult [email or web address] for contact info under    Signed, Berniece Salines, DO  09/07/2021 2:25 PM

## 2021-09-07 NOTE — Evaluation (Signed)
Physical Therapy Evaluation & Discharge Patient Details Name: Walter [name] MRN: [id number] DOB: 23-Jul-1950 Today's Date: 09/07/2021  History of Present Illness  71 year old M presenting from nursing facility with unresponsive episode while sitting on wheelchair. Found to have AKI, dehydration and encephalopathy.PHMx:severe dementia with psychosis, HTN and lower back pain  Clinical Impression  Patient admitted with above diagnosis. At baseline, patient is from SNF and is primarily bed and wheelchair bound and requires totalA. Patient is currently at his baseline level of functioning, therefore no further skilled PT needs identified. PT will sign off.      Recommendations for follow up therapy are one component of a multi-disciplinary discharge planning process, led by the attending physician.  Recommendations may be updated based on patient status, additional functional criteria and insurance authorization.  Follow Up Recommendations No PT follow up    Assistance Recommended at Discharge Frequent or constant Supervision/Assistance  Patient can return home with the following       Equipment Recommendations None recommended by PT  Recommendations for Other Services       Functional Status Assessment Patient has not had a recent decline in their functional status     Precautions / Restrictions Precautions Precautions: Fall Restrictions Weight Bearing Restrictions: No      Mobility  Bed Mobility Overal bed mobility: Needs Assistance Bed Mobility: Rolling, Supine to Sit, Sit to Supine Rolling: Total assist, +2 for physical assistance   Supine to sit: Total assist, +2 for physical assistance Sit to supine: Total assist, +2 for physical assistance        Transfers                        Ambulation/Gait                  Stairs            Wheelchair Mobility    Modified Rankin (Stroke Patients Only)       Balance Overall balance assessment:  Needs assistance Sitting-balance support: Bilateral upper extremity supported, Feet supported Sitting balance-Leahy Scale: Fair Sitting balance - Comments: Pt was initially zero with posterior and right lateral lean--progressed to fair (min guard A)                                     Pertinent Vitals/Pain Pain Assessment Pain Assessment: Faces Faces Pain Scale: No hurt Pain Intervention(s): Monitored during session    Home Living Family/patient expects to be discharged to:: Skilled nursing facility                        Prior Function Prior Level of Function : Needs assist  Cognitive Assist : Mobility (cognitive);ADLs (cognitive) Mobility (Cognitive):  (totalA) ADLs (Cognitive):  (totalA) Physical Assist : Mobility (physical);ADLs (physical) Mobility (physical): Bed mobility;Transfers ADLs (physical): Feeding;Grooming;Bathing;Dressing;Toileting;IADLs Mobility Comments: total A, bed and W/C bound ADLs Comments: total A     Hand Dominance   Dominant Hand:  (unknown)    Extremity/Trunk Assessment   Upper Extremity Assessment Upper Extremity Assessment: Defer to OT evaluation RUE Deficits / Details: Tends to keep them bent at elbows, but also saw him straighten them all the way out as well. Can have "death" grip with both hands when he feels he needs to LUE Deficits / Details: Tends to keep them bent at elbows, but also saw him straighten  them all the way out as well. Can have "death" grip with both hands when he feels he needs to    Lower Extremity Assessment Lower Extremity Assessment: Generalized weakness;Difficult to assess due to impaired cognition       Communication   Communication: Expressive difficulties  Cognition Arousal/Alertness: Awake/alert Behavior During Therapy: Flat affect Overall Cognitive Status: History of cognitive impairments - at baseline                                 General Comments: Wife reports pt  became a resident of SNF in October of last year and was very mobile and "busy"--since January of this year he has been like he is now.        General Comments      Exercises     Assessment/Plan    PT Assessment Patient does not need any further PT services  PT Problem List         PT Treatment Interventions      PT Goals (Current goals can be found in the Care Plan section)  Acute Rehab PT Goals Patient Stated Goal: unable to state PT Goal Formulation: With family    Frequency       Co-evaluation PT/OT/SLP Co-Evaluation/Treatment: Yes Reason for Co-Treatment: To address functional/ADL transfers;For patient/therapist safety;Necessary to address cognition/behavior during functional activity PT goals addressed during session: Mobility/safety with mobility         AM-PAC PT "6 Clicks" Mobility  Outcome Measure Help needed turning from your back to your side while in a flat bed without using bedrails?: Total Help needed moving from lying on your back to sitting on the side of a flat bed without using bedrails?: Total Help needed moving to and from a bed to a chair (including a wheelchair)?: Total Help needed standing up from a chair using your arms (e.g., wheelchair or bedside chair)?: Total Help needed to walk in hospital room?: Total Help needed climbing 3-5 steps with a railing? : Total 6 Click Score: 6    End of Session   Activity Tolerance: Patient tolerated treatment well Patient left: in bed;with call bell/phone within reach;with bed alarm set;with family/visitor present Nurse Communication: Mobility status PT Visit Diagnosis: Muscle weakness (generalized) (M62.81)    Time: [id number] PT Time Calculation (min) (ACUTE ONLY): 35 min   Charges:   PT Evaluation $PT Eval Moderate Complexity: 1 Mod PT Treatments $Therapeutic Activity: 8-22 mins        Raja Caputi A. Gilford Rile PT, DPT Acute Rehabilitation Services Pager [phone] Office  [phone]   Linna Hoff 09/07/2021, 12:56 PM

## 2021-09-07 NOTE — Progress Notes (Signed)
PHARMACY - PHYSICIAN COMMUNICATION CRITICAL VALUE ALERT - BLOOD CULTURE IDENTIFICATION (BCID)  Walter [name] is an 71 y.o. male who presented to [facility] on 09/06/2021 with a chief complaint of AMS.  Assessment:  Admitted for AKI with azotemia and hypernatremia, now growing E.coli in 1 of 4 blood cx bottles.  Name of physician (or Provider) Contacted: JGardner DO  Current antibiotics: None  Changes to prescribed antibiotics recommended:  Start Rocephin.  Results for orders placed or performed during the hospital encounter of 09/06/21  Blood Culture ID Panel (Reflexed) (Collected: 09/06/2021 10:41 AM)  Result Value Ref Range   Enterococcus faecalis NOT DETECTED NOT DETECTED   Enterococcus Faecium NOT DETECTED NOT DETECTED   Listeria monocytogenes NOT DETECTED NOT DETECTED   Staphylococcus species NOT DETECTED NOT DETECTED   Staphylococcus aureus (BCID) NOT DETECTED NOT DETECTED   Staphylococcus epidermidis NOT DETECTED NOT DETECTED   Staphylococcus lugdunensis NOT DETECTED NOT DETECTED   Streptococcus species NOT DETECTED NOT DETECTED   Streptococcus agalactiae NOT DETECTED NOT DETECTED   Streptococcus pneumoniae NOT DETECTED NOT DETECTED   Streptococcus pyogenes NOT DETECTED NOT DETECTED   A.calcoaceticus-baumannii NOT DETECTED NOT DETECTED   Bacteroides fragilis NOT DETECTED NOT DETECTED   Enterobacterales DETECTED (A) NOT DETECTED   Enterobacter cloacae complex NOT DETECTED NOT DETECTED   Escherichia coli DETECTED (A) NOT DETECTED   Klebsiella aerogenes NOT DETECTED NOT DETECTED   Klebsiella oxytoca NOT DETECTED NOT DETECTED   Klebsiella pneumoniae NOT DETECTED NOT DETECTED   Proteus species NOT DETECTED NOT DETECTED   Salmonella species NOT DETECTED NOT DETECTED   Serratia marcescens NOT DETECTED NOT DETECTED   Haemophilus influenzae NOT DETECTED NOT DETECTED   Neisseria meningitidis NOT DETECTED NOT DETECTED   Pseudomonas aeruginosa NOT DETECTED NOT DETECTED    Stenotrophomonas maltophilia NOT DETECTED NOT DETECTED   Candida albicans NOT DETECTED NOT DETECTED   Candida auris NOT DETECTED NOT DETECTED   Candida glabrata NOT DETECTED NOT DETECTED   Candida krusei NOT DETECTED NOT DETECTED   Candida parapsilosis NOT DETECTED NOT DETECTED   Candida tropicalis NOT DETECTED NOT DETECTED   Cryptococcus neoformans/gattii NOT DETECTED NOT DETECTED   CTX-M ESBL NOT DETECTED NOT DETECTED   Carbapenem resistance IMP NOT DETECTED NOT DETECTED   Carbapenem resistance KPC NOT DETECTED NOT DETECTED   Carbapenem resistance NDM NOT DETECTED NOT DETECTED   Carbapenem resist OXA 48 LIKE NOT DETECTED NOT DETECTED   Carbapenem resistance VIM NOT DETECTED NOT DETECTED    Vernard Gambles, PharmD, BCPS  09/07/2021  6:26 AM

## 2021-09-07 NOTE — Progress Notes (Signed)
°   09/06/21 2222  Assess: MEWS Score  BP 129/75  Pulse Rate (!) 105  Resp 17  SpO2 91 %  O2 Device Room Air  Assess: MEWS Score  MEWS Temp 0  MEWS Systolic 0  MEWS Pulse 1  MEWS RR 0  MEWS LOC 1  MEWS Score 2  MEWS Score Color Yellow  Assess: if the MEWS score is Yellow or Red  Were vital signs taken at a resting state? Yes  Focused Assessment Change from prior assessment (see assessment flowsheet)  Early Detection of Sepsis Score *See Row Information* Low  MEWS guidelines implemented *See Row Information* No, vital signs rechecked  Treat  MEWS Interventions Administered scheduled meds/treatments;Other (Comment)  Pain Scale Faces  Faces Pain Scale 0  Take Vital Signs  Increase Vital Sign Frequency  Yellow: Q 2hr X 2 then Q 4hr X 2, if remains yellow, continue Q 4hrs  Escalate  MEWS: Escalate Yellow: discuss with charge nurse/RN and consider discussing with provider and RRT  Notify: Charge Nurse/RN  Name of Charge Nurse/RN Notified Nikki, RN  Date Charge Nurse/RN Notified 09/06/21  Time Charge Nurse/RN Notified 2222  Notify: Provider  Provider Name/Title Dr. Julian Reil  Date Provider Notified 09/06/21  Time Provider Notified 2318  Notification Type Page  Notification Reason Change in status  Provider response At bedside  Date of Provider Response 09/06/21  Time of Provider Response 2318  Notify: Rapid Response  Name of Rapid Response RN Notified Theodoro Grist, RN  Date Rapid Response Notified 09/06/21  Time Rapid Response Notified 2222  Document  Patient Outcome Not stable and remains on department  Progress note created (see row info) Yes

## 2021-09-07 NOTE — Progress Notes (Signed)
Speech Language Pathology Treatment: Dysphagia  Patient Details Name: Walter [name] MRN: [id number] DOB: September 04, 1950 Today's Date: 09/07/2021 Time: [id number] SLP Time Calculation (min) (ACUTE ONLY): 24 min  Assessment / Plan / Recommendation Clinical Impression  Pt was seen for dysphagia treatment with his wife present. His daughter was contacted prior to the session and she reported that the pt has been consuming "finely chopped" foods with some purees such as mashed potatoes. She further stated that the pt does not typically use straws due to his demonstrating signs of aspiration with thin liquids when they are used. These reports were also given by his wife who added that pt was self-feeding in November, 2022 when he was moved to the SNF, but is now unable to. Pt was alert and cooperative during the session and intermittently communicated verbally. Pt tolerated dysphagia 1, dysphagia 2 and individual sips of thin liquids via cup without overt s/sx of aspiration. Pt inconsistently exhibited coughing with consecutive swallows of thin liquids via cup. Mastication was prolonged with dysphagia 2 solids and pt's wife reported this was more pronounced than his baseline. Mild oral residue from dysphagia 2 solids was cleared with a liquid wash. Pt's diet will be advanced to dysphagia 1 with thin liquids and SLP will continue to follow pt.    HPI HPI: Pt is a 71 year old male who presented from nursing facility with unresponsive episode while sitting on wheelchair. Pt admitted for AKI, dehydration and acute metabolic encephalopathy. CT head negative. CXR negative. PMH: severe dementia with psychosis, HTN and lower back pain.      SLP Plan  Continue with current plan of care      Recommendations for follow up therapy are one component of a multi-disciplinary discharge planning process, led by the attending physician.  Recommendations may be updated based on patient status, additional functional criteria  and insurance authorization.    Recommendations  Diet recommendations: Dysphagia 1 (puree);Thin liquid Liquids provided via: Cup;No straw Medication Administration: Crushed with puree Supervision: Full supervision/cueing for compensatory strategies;Trained caregiver to feed patient Compensations: Slow rate;Small sips/bites Postural Changes and/or Swallow Maneuvers: Seated upright 90 degrees                Oral Care Recommendations: Oral care BID Follow Up Recommendations:  (TBD) Assistance recommended at discharge: Frequent or constant Supervision/Assistance SLP Visit Diagnosis: Dysphagia, unspecified (R13.10) Plan: Continue with current plan of care          Walter [name] Negus, [place], [place] Office number [phone] Pager [place]  09/07/2021, 1:49 PM

## 2021-09-07 NOTE — Consult Note (Signed)
[facility] New Face-to-Face Psychiatric Evaluation   Service Date: September 07, 2021 LOS: 0  Assessment  Walter [name] is a 71 y.o. male admitted medically for 09/06/2021  8:47 AM for AMS. He carries the past medical history of severe dementia with psychosis and hypertension. Psychiatry was consulted for antipsychotic and dystonia by Nolberto Hanlon, MD   His current presentation of stupor, brittle rigidity, not eating or drinking and muscle resistance is most consistent with hypokinetic catatonia. BFC catatonia rating 13.  Current outpatient psychotropic medications include depakote, Haldol, paliperidone, Ativan, memantine.   He was  compliant with medications prior to admission as patient was at a SNF. On initial examination, patient appeared to be unresponsive consistent with some features of catatonia, given medical illness, the risks outweighs possible benefits. Recommend continuing as needed Ativan for agitation, patient pulled out his IV during evaluation.  Also to hold antipsychotic for possible catatonia.  Diagnoses:  Active Hospital problems: Principal Problem:   AKI with azotemia Active Problems:   Low back pain   Essential hypertension   Acute metabolic encephalopathy in patient with severe dementia and psychotic disturbance   Lactic acidosis   Normocytic anemia   Goals of care, counseling/discussion   Dystonia   Hypernatremia   Physical deconditioning     Plan  ## Safety and Observation Level:  - Based on my clinical evaluation, I estimate the patient to be not at risk of intentional self harm in the current setting - At this time, we recommend a regular level of observation. This decision is based on my review of the chart including patient's history and current presentation, interview of the patient, mental status examination, and consideration of suicide risk including evaluating suicidal ideation, plan, intent, suicidal or self-harm behaviors, risk  factors, and protective factors. This judgment is based on our ability to directly address suicide risk, implement suicide prevention strategies and develop a safety plan while the patient is in the clinical setting. Please contact our team if there is a concern that risk level has changed.   ## Medications:  --Continue Ativan 0.5 mg every 6 hours as needed for agitation --Hold all antipsychotics --Can continue Depakote 250 mg twice daily   ## Medical Decision Making Capacity:  Deferred  ## Further Work-up:  --Deferred to primary team    -- Most recent EKG on 2/16 had QTc 437  ## Disposition:  --Pending clinical improvement  ## Behavioral / Environmental:  --Fall risk  ##Legal Status --Deferred  Thank you for this consult request. Recommendations have been communicated to the primary team.  We will follow at this time.   Merrily Brittle, DO   NEW history  Relevant Aspects of Hospital Course:  Admitted on 09/06/2021 for AMS.  Patient Report:  Deferred given AMS.  ROS:  Deferred given AMS.   Psychiatric History:  Information collected from chart review  Family psych history: Deferred   Social History:  Per chart: "that he has never smoked. He has never used smokeless tobacco. He reports that he does not currently use alcohol. No history on file for drug use"  Family History:  The patient's family history is not on file.  Medical History: Past Medical History:  Diagnosis Date   Dementia ([place])    Hallucinations     Surgical History: History reviewed. No pertinent surgical history.  Medications:   Current Facility-Administered Medications:    acetaminophen (TYLENOL) tablet 650 mg, 650 mg, Oral, Q6H PRN **OR** acetaminophen (TYLENOL) suppository 650 mg,  650 mg, Rectal, Q6H PRN, Gonfa, Taye T, MD   cefTRIAXone (ROCEPHIN) 2 g in sodium chloride 0.9 % 100 mL IVPB, 2 g, Intravenous, Q24H, Bryk, Veronda P, RPH, Last Rate: 200 mL/hr at 09/07/21 0849, 2 g at  09/07/21 0849   divalproex (DEPAKOTE SPRINKLE) capsule 250 mg, 250 mg, Oral, BID, Gonfa, Taye T, MD, 250 mg at 09/07/21 1300   heparin injection 5,000 Units, 5,000 Units, Subcutaneous, Q8H, Gonfa, Taye T, MD, 5,000 Units at 09/07/21 1555   lactated ringers infusion, , Intravenous, Continuous, Gonfa, Taye T, MD, Last Rate: 125 mL/hr at 09/07/21 1257, New Bag at 09/07/21 1257   LORazepam (ATIVAN) injection 0.5 mg, 0.5 mg, Intravenous, Q6H PRN, Cyndia Skeeters, Taye T, MD  Allergies: No Known Allergies    Objective  Psychiatric Specialty Exam: Physical Exam Vitals and nursing note reviewed.  Constitutional:      Appearance: He is ill-appearing. He is not diaphoretic.     Comments: Altered, non-responsive  Pulmonary:     Effort: Pulmonary effort is normal.  Musculoskeletal:     Comments: Involuntary resistance to passive movement of limb to a new position with increased resistance with the speed of movement  Skin:    General: Skin is warm and dry.     Review of Systems  Unable to perform ROS: Mental status change    Body mass index is 26.23 kg/m. Temp:  [98.1 F (36.7 C)-99.6 F (37.6 C)] 98.5 F (36.9 C) (02/17 0751) Pulse Rate:  [67-105] 67 (02/17 0751) Cardiac Rhythm: Sinus bradycardia (02/17 1300) Resp:  [16-17] 16 (02/17 0751) BP: (92-129)/(52-75) 107/62 (02/17 0751) SpO2:  [91 %-100 %] 100 % (02/17 0751)  General Appearance:  Disheveled (In hospital scrubs)    Eye Contact:   None (Eyes closed, AMS)    Speech:   Other (comment) (None, pt is nonverbal at baseline. Currently encephalopathic)    Volume:   Other (comment) (None, non-verbal at baseline. Currently encephalopathic)    Mood:   -- (Nonverbal at baseline. Deferred given AMS)    Affect:   -- (Dysphoric. Blunt.)    Thought Process:  -- (AMS)  Descriptions of Associations: -- (AMS)  Duration of Psychotic Symptoms:  Less than six months  Past Diagnosis of Schizophrenia or Psychoactive disorder:  No    Orientation:   -- (AMS)   Thought Content:   -- (AMS)  Hallucinations: Hallucinations: -- (AMS)  Ideas of Reference: -- (AMS)   Suicidal Thoughts:   -- (AMS)   Homicidal Thoughts:   -- (AMS)    Memory:   -- (AMS)    Judgement:   -- (AMS)   Insight:   -- (AMS)    Psychomotor Activity:   Increased; Other (comment) (Hip girdle rigidity. R elbow extension and flexion showed some resistance. No cogwheeling noted. No tremors)    Concentration:   -- (AMS)    Attention Span:   -- (AMS)   Recall:   -- (AMS)    Fund of Knowledge:   -- (AMS)    Language:   -- (AMS)    Handed:   -- (Deferred given AMS)    Assets:   Social Support    Sleep:   -- (AMS)     AIMS:  , ,  ,  ,     CIWA:   COWS:      Signed: Merrily Brittle, DO Psychiatry Resident, PGY-1 [facility] 09/07/2021, 5:21 PM
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
## 2021-09-07 NOTE — Progress Notes (Signed)
°   09/07/21 0751  Assess: MEWS Score  Temp 98.5 F (36.9 C)  BP 107/62  Pulse Rate 67  Resp 16  SpO2 100 %  O2 Device Nasal Cannula  O2 Flow Rate (L/min) 0.5 L/min  Assess: MEWS Score  MEWS Temp 0  MEWS Systolic 0  MEWS Pulse 0  MEWS RR 0  MEWS LOC 2  MEWS Score 2  MEWS Score Color Yellow  Assess: if the MEWS score is Yellow or Red  Were vital signs taken at a resting state? Yes  Focused Assessment No change from prior assessment  Early Detection of Sepsis Score *See Row Information* Low  MEWS guidelines implemented *See Row Information* No, previously yellow, continue vital signs every 4 hours  Treat  MEWS Interventions Other (Comment) (assessed pt)  Pain Scale PAINAD  Faces Pain Scale 0  Take Vital Signs  Increase Vital Sign Frequency  Yellow: Q 2hr X 2 then Q 4hr X 2, if remains yellow, continue Q 4hrs  Notify: Charge Nurse/RN  Name of Charge Nurse/RN Notified Candice  Date Charge Nurse/RN Notified 09/07/21  Time Charge Nurse/RN Notified 0815  Notify: Provider  Provider Name/Title Lynn Ito  Date Provider Notified 09/07/21  Time Provider Notified [phone]  Notification Type Face-to-face  Notification Reason Other (Comment)

## 2021-09-08 NOTE — Progress Notes (Signed)
PROGRESS NOTE    Walter [name]  [id number] DOB: 1951-04-17 DOA: 09/06/2021 PCP: Merrilee Seashore, MD    Brief Narrative:  71 year old M with history of severe dementia with psychosis, HTN and lower back pain presenting from nursing facility with unresponsive episode while sitting on wheelchair.    Patient is sleepy and wakes to voice with gentle tap on his shoulder.  He does not talk or follow command.  Per patient's daughter, he has severe dementia totally dependent for ADL's.  He was not talking or responding over the last 2 days.  He was not eating or drinking much either.  No seizure-like shaking.  Did not notice any respiratory, GI or UTI symptoms.  Denies new medication change.   In ED, vital signs within normal. Na 147. Cr 1.83 (baseline 1.0).  BUN 46.  LA 2.1.  Hgb 10.9> 10.5.  Ammonia 14.  CT head and CXR personally reviewed without acute finding.  EKG personally reviewed features NSR at 89 with artifacts.  VBG without significant finding.  Received a liter of LR bolus.  UA, blood culture and MRI brain ordered.  Hospitalist service consulted for admission for AKI, dehydration and encephalopathy.      2/17 overnight with SB, SP.  Consulted cardiology this a.m. 2/18 eyes open today. Mumbling under his breath,unable to understand him  Consultants:    Procedures:   Antimicrobials:      Subjective:  Unable to obtain answers to questions   Objective: Vitals:   09/07/21 1850 09/07/21 2112 09/08/21 0403 09/08/21 0858  BP: 110/67 121/65 112/60 119/61  Pulse: 81 78 75 76  Resp: 18 17 16 16   Temp: 98.4 F (36.9 C) 98.2 F (36.8 C) 99.4 F (37.4 C)   TempSrc: Oral Oral Oral   SpO2:  100% 99% 100%  Height:        Intake/Output Summary (Last 24 hours) at 09/08/2021 1359 Last data filed at 09/08/2021 0700 Gross per 24 hour  Intake 893.89 ml  Output --  Net 893.89 ml   There were no vitals filed for this visit.  Examination: Calm, NAD, more awake today Cta no  w/r Reg s1/s2 no gallop Soft benign +bs No edema Awake , otherwise unable to assess Mood and affect appropriate in current setting     Data Reviewed: I have personally reviewed following labs and imaging studies  CBC: Recent Labs  Lab 09/06/21 0939 09/06/21 1020 09/07/21 0401  WBC 6.4  --  5.4  NEUTROABS 5.2  --   --   HGB 10.9* 10.5* 8.5*  HCT 32.4* 31.0* 25.3*  MCV 101.9*  --  100.8*  PLT 246  --  [id number]   Basic Metabolic Panel: Recent Labs  Lab 09/06/21 0939 09/06/21 1020 09/06/21 1724 09/07/21 0401  NA 147* 146* 145 145  K 4.4 4.6 4.3 4.0  CL 107  --  108 109  CO2 30  --  27 27  GLUCOSE 127*  --  125* 118*  BUN 46*  --  47* 45*  CREATININE 1.83*  --  1.61* 1.45*  CALCIUM 9.4  --  8.9 8.7*  MG  --   --   --  2.3  PHOS  --   --  4.1 3.9   GFR: CrCl cannot be calculated (Unknown ideal weight.). Liver Function Tests: Recent Labs  Lab 09/06/21 0939 09/06/21 1724 09/07/21 0401  AST 34  --  30  ALT 39  --  31  ALKPHOS 61  --  57  BILITOT 0.9  --  1.3*  PROT 7.4  --  6.2*  ALBUMIN 3.1* 2.6* 2.4*   No results for input(s): LIPASE, AMYLASE in the last 168 hours. Recent Labs  Lab 09/06/21 0939  AMMONIA 14   Coagulation Profile: No results for input(s): INR, PROTIME in the last 168 hours. Cardiac Enzymes: Recent Labs  Lab 09/06/21 1724 09/07/21 0401  CKTOTAL 648* 524*   BNP (last 3 results) No results for input(s): PROBNP in the last 8760 hours. HbA1C: No results for input(s): HGBA1C in the last 72 hours. CBG: Recent Labs  Lab 09/06/21 0937  GLUCAP 112*   Lipid Profile: No results for input(s): CHOL, HDL, LDLCALC, TRIG, CHOLHDL, LDLDIRECT in the last 72 hours. Thyroid Function Tests: Recent Labs    09/06/21 1724  TSH 3.310   Anemia Panel: Recent Labs    09/07/21 0401  VITAMINB12 853  FOLATE 3.9*  FERRITIN 488*  TIBC 127*  IRON 18*  RETICCTPCT 1.6   Sepsis Labs: Recent Labs  Lab 09/06/21 0939 09/06/21 1724  LATICACIDVEN  2.1* 1.7    Recent Results (from the past 240 hour(s))  Blood Cultures (routine x 2)     Status: None (Preliminary result)   Collection Time: 09/06/21  9:39 AM   Specimen: BLOOD  Result Value Ref Range Status   Specimen Description BLOOD RIGHT ANTECUBITAL  Final   Special Requests   Final    BOTTLES DRAWN AEROBIC AND ANAEROBIC Blood Culture adequate volume   Culture   Final    NO GROWTH 2 DAYS Performed at [facility], [place] [address] [place], [place] [address]    Report Status PENDING  Incomplete  Blood Cultures (routine x 2)     Status: Abnormal (Preliminary result)   Collection Time: 09/06/21 10:41 AM   Specimen: BLOOD  Result Value Ref Range Status   Specimen Description BLOOD SITE NOT SPECIFIED  Final   Special Requests   Final    BOTTLES DRAWN AEROBIC AND ANAEROBIC Blood Culture results may not be optimal due to an excessive volume of blood received in culture bottles   Culture  Setup Time   Final    GRAM NEGATIVE RODS IN BOTH AEROBIC AND ANAEROBIC BOTTLES CRITICAL RESULT CALLED TO, READ BACK BY AND VERIFIED WITH: V BRYK,PHARMD@0615  09/07/21 [place]    Culture (A)  Final    ESCHERICHIA COLI SUSCEPTIBILITIES TO FOLLOW Performed at [facility], [address], [place], [place] [address]    Report Status PENDING  Incomplete  Blood Culture ID Panel (Reflexed)     Status: Abnormal   Collection Time: 09/06/21 10:41 AM  Result Value Ref Range Status   Enterococcus faecalis NOT DETECTED NOT DETECTED Final   Enterococcus Faecium NOT DETECTED NOT DETECTED Final   Listeria monocytogenes NOT DETECTED NOT DETECTED Final   Staphylococcus species NOT DETECTED NOT DETECTED Final   Staphylococcus aureus (BCID) NOT DETECTED NOT DETECTED Final   Staphylococcus epidermidis NOT DETECTED NOT DETECTED Final   Staphylococcus lugdunensis NOT DETECTED NOT DETECTED Final   Streptococcus species NOT DETECTED NOT DETECTED Final   Streptococcus agalactiae NOT DETECTED NOT DETECTED Final    Streptococcus pneumoniae NOT DETECTED NOT DETECTED Final   Streptococcus pyogenes NOT DETECTED NOT DETECTED Final   A.calcoaceticus-baumannii NOT DETECTED NOT DETECTED Final   Bacteroides fragilis NOT DETECTED NOT DETECTED Final   Enterobacterales DETECTED (A) NOT DETECTED Final    Comment: Enterobacterales represent a large order of gram negative bacteria, not  a single organism. CRITICAL RESULT CALLED TO, READ BACK BY AND VERIFIED WITH: V BRYK,PHARMD@0615  09/07/21 [place]    Enterobacter cloacae complex NOT DETECTED NOT DETECTED Final   Escherichia coli DETECTED (A) NOT DETECTED Final    Comment: CRITICAL RESULT CALLED TO, READ BACK BY AND VERIFIED WITH: V BRYK,PHARMD@0615  09/07/21 [place]    Klebsiella aerogenes NOT DETECTED NOT DETECTED Final   Klebsiella oxytoca NOT DETECTED NOT DETECTED Final   Klebsiella pneumoniae NOT DETECTED NOT DETECTED Final   Proteus species NOT DETECTED NOT DETECTED Final   Salmonella species NOT DETECTED NOT DETECTED Final   Serratia marcescens NOT DETECTED NOT DETECTED Final   Haemophilus influenzae NOT DETECTED NOT DETECTED Final   Neisseria meningitidis NOT DETECTED NOT DETECTED Final   Pseudomonas aeruginosa NOT DETECTED NOT DETECTED Final   Stenotrophomonas maltophilia NOT DETECTED NOT DETECTED Final   Candida albicans NOT DETECTED NOT DETECTED Final   Candida auris NOT DETECTED NOT DETECTED Final   Candida glabrata NOT DETECTED NOT DETECTED Final   Candida krusei NOT DETECTED NOT DETECTED Final   Candida parapsilosis NOT DETECTED NOT DETECTED Final   Candida tropicalis NOT DETECTED NOT DETECTED Final   Cryptococcus neoformans/gattii NOT DETECTED NOT DETECTED Final   CTX-M ESBL NOT DETECTED NOT DETECTED Final   Carbapenem resistance IMP NOT DETECTED NOT DETECTED Final   Carbapenem resistance KPC NOT DETECTED NOT DETECTED Final   Carbapenem resistance NDM NOT DETECTED NOT DETECTED Final   Carbapenem resist OXA 48 LIKE NOT DETECTED NOT DETECTED Final    Carbapenem resistance VIM NOT DETECTED NOT DETECTED Final    Comment: Performed at [facility], [address], [place], [place] [address]  Resp Panel by RT-PCR (Flu A&B, Covid) Nasopharyngeal Swab     Status: None   Collection Time: 09/06/21 11:59 AM   Specimen: Nasopharyngeal Swab; Nasopharyngeal(NP) swabs in vial transport medium  Result Value Ref Range Status   SARS Coronavirus 2 by RT PCR NEGATIVE NEGATIVE Final    Comment: (NOTE) SARS-CoV-2 target nucleic acids are NOT DETECTED.  The SARS-CoV-2 RNA is generally detectable in upper respiratory specimens during the acute phase of infection. The lowest concentration of SARS-CoV-2 viral copies this assay can detect is 138 copies/mL. A negative result does not preclude SARS-Cov-2 infection and should not be used as the sole basis for treatment or other patient management decisions. A negative result may occur with  improper specimen collection/handling, submission of specimen other than nasopharyngeal swab, presence of viral mutation(s) within the areas targeted by this assay, and inadequate number of viral copies(<138 copies/mL). A negative result must be combined with clinical observations, patient history, and epidemiological information. The expected result is Negative.  Fact Sheet for Patients:  EntrepreneurPulse.com.au  Fact Sheet for Healthcare Providers:  IncredibleEmployment.be  This test is no t yet approved or cleared by the [place] FDA and  has been authorized for detection and/or diagnosis of SARS-CoV-2 by FDA under an Emergency Use Authorization (EUA). This EUA will remain  in effect (meaning this test can be used) for the duration of the COVID-19 declaration under Section 564(b)(1) of the Act, 21 U.S.C.section 360bbb-3(b)(1), unless the authorization is terminated  or revoked sooner.       Influenza A by PCR NEGATIVE NEGATIVE Final   Influenza B by PCR NEGATIVE  NEGATIVE Final    Comment: (NOTE) The Xpert Xpress SARS-CoV-2/FLU/RSV plus assay is intended as an aid in the diagnosis of influenza from Nasopharyngeal swab specimens and should not be used  as a sole basis for treatment. Nasal washings and aspirates are unacceptable for Xpert Xpress SARS-CoV-2/FLU/RSV testing.  Fact Sheet for Patients: EntrepreneurPulse.com.au  Fact Sheet for Healthcare Providers: IncredibleEmployment.be  This test is not yet approved or cleared by the [place] FDA and has been authorized for detection and/or diagnosis of SARS-CoV-2 by FDA under an Emergency Use Authorization (EUA). This EUA will remain in effect (meaning this test can be used) for the duration of the COVID-19 declaration under Section 564(b)(1) of the Act, 21 U.S.C. section 360bbb-3(b)(1), unless the authorization is terminated or revoked.  Performed at [facility], [place] [address] [place], [place] [address]          Radiology Studies: MR BRAIN WO CONTRAST  Result Date: 09/06/2021 CLINICAL DATA:  Unresponsiveness. History of dementia and psychosis, hypertension EXAM: MRI HEAD WITHOUT CONTRAST TECHNIQUE: Multiplanar, multiecho pulse sequences of the brain and surrounding structures were obtained without intravenous contrast. COMPARISON:  Same-day noncontrast CT head FINDINGS: Brain: There is no evidence of acute intracranial hemorrhage, extra-axial fluid collection, or acute infarct. There is mild global parenchymal volume loss with prominence of the ventricular system and extra-axial CSF spaces. Patchy FLAIR signal abnormality in the subcortical and periventricular white matter likely reflects sequela of mild chronic white matter microangiopathy. There is no suspicious parenchymal signal abnormality. There is no mass lesion. There is no midline shift. Vascular: Normal flow voids. Skull and upper cervical spine: Normal marrow signal. Sinuses/Orbits:  The paranasal sinuses are clear. The globes and orbits are unremarkable. Other: None. IMPRESSION: No acute intracranial pathology. Electronically Signed   By: Valetta Mole M.D.   On: 09/06/2021 15:02        Scheduled Meds:  divalproex  250 mg Oral BID   heparin  5,000 Units Subcutaneous Q8H   Continuous Infusions:  cefTRIAXone (ROCEPHIN)  IV 2 g (09/08/21 0815)   lactated ringers 125 mL/hr at 09/08/21 0448    Assessment & Plan:   Principal Problem:   AKI with azotemia Active Problems:   Low back pain   Essential hypertension   Acute metabolic encephalopathy in patient with severe dementia and psychotic disturbance   Lactic acidosis   Normocytic anemia   Goals of care, counseling/discussion   Dystonia   Hypernatremia   Physical deconditioning   AKI with Azotemia Prerenal. 2/18 improving with IV fluids Monitor labs Avoid nephrotoxic meds   Hypernatremia Most likely hypovolemic from dehydration 2/18 stable, received IV fluids     Acute metabolic encephalopathy in patient with severe dementia and psychotic disturbance- (present on admission) Possibly due to electrolyte/renal derangement Correcting the above Psych consulted input appreciated Continue Ativan 0.5 mg every 6 hours as needed for agitation Hold all antipsychotics 2/18 continue Depakote 50 mg twice daily     Normocytic anemia H&H stable     Lactic acidosis- (present on admission) Likely due to dehydration. Improved with ivf   Dystonia- (present on admission) Patient is on Invega and Haldol -Psychiatry consulted.see above   Sinus pause Now in SR Cards consulted, input appreciated Pt not great candidate for ppm    Physical deconditioning Seems he is wheelchair-bound at baseline. -PT/OT consulted   Essential hypertension- (present on admission) Normotensive. -Hold home amlodipine   Low back pain- (present on admission) Tylenol as needed           DVT prophylaxis:  heparin  Code Status:full Family Communication: none at bedside Disposition Plan:  Status is: inpatient The patient remains inpatient due to IV fluids  and MS changes             LOS: 1 day   Time spent: 35 min    Nolberto Hanlon, MD [facility] Pager [phone]  If 7PM-7AM, please contact night-coverage 09/08/2021, 1:59 PM

## 2021-09-08 NOTE — Plan of Care (Signed)
  Problem: Education: Goal: Knowledge of General Education information will improve Description: Including pain rating scale, medication(s)/side effects and non-pharmacologic comfort measures Outcome: Not Progressing   

## 2021-09-08 NOTE — Plan of Care (Signed)
°  Problem: Education: Goal: Knowledge of General Education information will improve Description: Including pain rating scale, medication(s)/side effects and non-pharmacologic comfort measures Outcome: Progressing   Problem: Health Behavior/Discharge Planning: Goal: Ability to manage health-related needs will improve Outcome: Not Progressing   Problem: Clinical Measurements: Goal: Ability to maintain clinical measurements within normal limits will improve Outcome: Progressing Goal: Will remain free from infection Outcome: Progressing Goal: Diagnostic test results will improve Outcome: Progressing Goal: Respiratory complications will improve Outcome: Progressing Goal: Cardiovascular complication will be avoided Outcome: Progressing   Problem: Activity: Goal: Risk for activity intolerance will decrease Outcome: Progressing   Problem: Nutrition: Goal: Adequate nutrition will be maintained Outcome: Progressing   Problem: Coping: Goal: Level of anxiety will decrease Outcome: Progressing   Problem: Elimination: Goal: Will not experience complications related to bowel motility Outcome: Progressing Goal: Will not experience complications related to urinary retention Outcome: Progressing   Problem: Pain Managment: Goal: General experience of comfort will improve Outcome: Progressing

## 2021-09-09 ENCOUNTER — Encounter (HOSPITAL_COMMUNITY): Payer: Self-pay | Admitting: Student

## 2021-09-09 LAB — CBC
HCT: 29 % — ABNORMAL LOW (ref 39.0–52.0)
Hemoglobin: 9.5 g/dL — ABNORMAL LOW (ref 13.0–17.0)
MCH: 34.1 pg — ABNORMAL HIGH (ref 26.0–34.0)
MCHC: 32.8 g/dL (ref 30.0–36.0)
MCV: 103.9 fL — ABNORMAL HIGH (ref 80.0–100.0)
Platelets: 198 10*3/uL (ref 150–400)
RBC: 2.79 MIL/uL — ABNORMAL LOW (ref 4.22–5.81)
RDW: 12.7 % (ref 11.5–15.5)
WBC: 8.1 10*3/uL (ref 4.0–10.5)
nRBC: 0 % (ref 0.0–0.2)

## 2021-09-09 LAB — CREATININE, SERUM
Creatinine, Ser: 1.11 mg/dL (ref 0.61–1.24)
GFR, Estimated: >60 mL/min (ref >60)

## 2021-09-09 LAB — CK: Total CK: 254 U/L (ref 49–397)

## 2021-09-09 MED ORDER — DAKINS (1/4 STRENGTH) 0.125 % EX SOLN
Freq: Two times a day (BID) | CUTANEOUS | Status: DC
Start: 2021-09-09 — End: 2021-09-10
  Administered 2021-09-10: 1 via TOPICAL
  Filled 2021-09-09: qty 473

## 2021-09-09 MED ORDER — COLLAGENASE 250 UNIT/GM EX OINT
TOPICAL_OINTMENT | Freq: Every day | CUTANEOUS | Status: DC
  Filled 2021-09-09: qty 30

## 2021-09-09 NOTE — Consult Note (Signed)
Walter [name] 1950-08-26  [id number].    Requesting MD: Dr. Nolberto Hanlon Chief Complaint/Reason for Consult: sacral wound  HPI:  This is a 71 yo black male with a history of severe dementia with psychosis and HTN who was admitted secondary to unresponsive nature while sitting in his wheelchair at his facility.  Patient is nonverbal and bedbound.  He is not eating or drinking much.  History of obtained from the chart as he is nonverbal and no family is present.  While here he has been found to have a sacral decubitus ulcer.  Palliative care has seen him and he is a DNR, but family wishes for continuation of current care.  ROS: ROS: Unable as he is demented and nonverbal  History reviewed. No pertinent family history.  Past Medical History:  Diagnosis Date   Dementia ([place])    Hallucinations     History reviewed. No pertinent surgical history.  Social History:  reports that he has never smoked. He has never used smokeless tobacco. He reports that he does not currently use alcohol. No history on file for drug use.  Allergies: No Known Allergies  Medications Prior to Admission  Medication Sig Dispense Refill   acetaminophen (TYLENOL) 500 MG tablet Take 500 mg by mouth every 6 (six) hours as needed for headache, fever or mild pain.     aluminum-magnesium hydroxide 200-200 MG/5ML suspension Take 30 mLs by mouth every 6 (six) hours as needed for indigestion (heartburn).     amLODipine (NORVASC) 5 MG tablet Take 5 mg by mouth daily.     divalproex (DEPAKOTE) 250 MG DR tablet Take 250 mg by mouth 2 (two) times daily.     guaiFENesin (ROBITUSSIN) 100 MG/5ML liquid Take 200 mg by mouth every 6 (six) hours as needed for cough.     loperamide (IMODIUM) 2 MG capsule Take 2 mg by mouth as needed for diarrhea or loose stools (**max 8 doses in 24  hours**).     LORazepam (ATIVAN) 0.5 MG tablet Take 0.5 mg by mouth every evening.     magnesium hydroxide (MILK OF MAGNESIA) 400 MG/5ML suspension  Take 30 mLs by mouth at bedtime as needed for mild constipation.     memantine (NAMENDA) 10 MG tablet Take 10 mg by mouth 2 (two) times daily.     Menthol-Zinc Oxide (CALMOSEPTINE) 0.44-20.6 % OINT Apply 1 application topically as needed (to scrotum after each incontinence episode).     neomycin-bacitracin-polymyxin (NEOSPORIN) [phone] ointment Apply 1 application topically daily as needed (minor skin tears/abrasions).     OVER THE COUNTER MEDICATION Take 1 Bottle by mouth in the morning, at noon, and at bedtime.     paliperidone (INVEGA) 3 MG 24 hr tablet Take 3 mg by mouth daily.     haloperidol (HALDOL) 5 MG tablet Take 1-2 tablets (5-10 mg total) by mouth every 6 (six) hours as needed for agitation. 5 mg  in the morning and afternoon 10 mg at bedtime (Patient not taking: Reported on 09/06/2021) 40 tablet 0     Physical Exam: Blood pressure 111/66, pulse 72, temperature 99.1 F (37.3 C), temperature source Temporal, resp. rate 16, height 6\' 6"  (1.981 m), SpO2 96 %. General: pleasant, male who is laying in bed in NAD, nonverbal Skin: warm and dry with no masses, lesions, or rashes.  A large 8x14cm sacral wound present.  Firm eschar present with slight softening centrally.  Malodor secondary likely to necrotic eschar.  No overt evidence  of infection noted, but can't tell what may be underneath.      Results for orders placed or performed during the hospital encounter of 09/06/21 (from the past 48 hour(s))  CBC     Status: Abnormal   Collection Time: 09/09/21  3:45 AM  Result Value Ref Range   WBC 8.1 4.0 - 10.5 K/uL   RBC 2.79 (L) 4.22 - 5.81 MIL/uL   Hemoglobin 9.5 (L) 13.0 - 17.0 g/dL   HCT 29.0 (L) 39.0 - 52.0 %   MCV 103.9 (H) 80.0 - 100.0 fL   MCH 34.1 (H) 26.0 - 34.0 pg   MCHC 32.8 30.0 - 36.0 g/dL   RDW 12.7 11.5 - 15.5 %   Platelets 198 150 - 400 K/uL   nRBC 0.0 0.0 - 0.2 %    Comment: Performed at [facility], [place] [address] [place], [place] [address]   Creatinine, serum     Status: None   Collection Time: 09/09/21  3:45 AM  Result Value Ref Range   Creatinine, Ser 1.11 0.61 - 1.24 mg/dL   GFR, Estimated >60 >60 mL/min    Comment: (NOTE) Calculated using the CKD-EPI Creatinine Equation (2021) Performed at [place] [address] [place], [place] [address]   CK     Status: None   Collection Time: 09/09/21  3:45 AM  Result Value Ref Range   Total CK 254 49 - 397 U/L    Comment: Performed at [facility], [place] [address] [place], [place] [address]   No results found.    Assessment/Plan Unstageable sacral wound The patient has been assessed and consult and progress notes reviewed.  He is currently a DNR with severe dementia and psychosis, nonverbal, bedbound, and with poor nutrition.  We would not recommend any aggressive plans for this wound as the patient will very likely never be able to heal this wound, given the above factors.  His WBC is normal and he is AF also suggesting against any acute infection of the wound itself.  Given we can't see underneath this, it is a possibility he could have osteomyelitis as the etiology of his bacteremia but would not recommend full aggressive work up for this given his state, but continue abx therapy.  Recommend conservative treatment including santyl and hydrotherapy as per the families wish for continued current care.  No plans for surgical intervention.  Discussed with primary MD.  FEN - D1 VTE - per medicine ID - Rocephin, for bacteremia   Straightforward Medical Decision Making  Henreitta Cea, [facility] 09/09/2021, 10:30 AM Please see Amion for pager number during day hours 7:00am-4:30pm or 7:00am -11:30am on weekends

## 2021-09-09 NOTE — Consult Note (Signed)
WOC Nurse Consult Note: Reason for Consult: Large, malodorous Unstageable pressure injury to sacrum. Odor is consistent with nonviable tissue Wound type:Pressure Pressure Injury POA: Yes Measurement: 8cm x 14cm  Wound SKA:JGOTL firmly attached eschar, no fluctuance Drainage (amount, consistency, odor) None Periwound: intact Dressing procedure/placement/frequency: Patient is on a mattress replacement with low air loss feature. I will add pressure redistribution heel boots (Prevalon). Turning and repositioning is already in place.  Topical care will be with sodium hypochlorite solution (Dakin's) moistened gauze twice daily for 3 days.  Thereafter, topical care will be twice daily NS gauze.  Recommend:  CCS consultation for consideration of debriding wound at bedside vs topical care vs hydrotherapy and conservative sharpe wound debridement (CSWD) by PT.  Of course, this is dependent on Goals of Care and consideration of risks vs benefit considering that patient has little likelihood of wound healing and a high likelihood of osteomyelitis.  Discussed with Dr. Marylu Lund and she will contact [facility]CS.  WOC nursing team will not follow, but will remain available to this patient, the nursing and medical teams.  Please re-consult if needed. Thanks, Ladona Mow, MSN, RN, GNP, [place]  Pager# [phone]

## 2021-09-09 NOTE — Progress Notes (Signed)
PROGRESS NOTE    Walter [name]  [id number] DOB: 07-29-1950 DOA: 09/06/2021 PCP: Merrilee Seashore, MD    Brief Narrative:  71 year old M with history of severe dementia with psychosis, HTN and lower back pain presenting from nursing facility with unresponsive episode while sitting on wheelchair.    Patient is sleepy and wakes to voice with gentle tap on his shoulder.  He does not talk or follow command.  Per patient's daughter, he has severe dementia totally dependent for ADL's.  He was not talking or responding over the last 2 days.  He was not eating or drinking much either.  No seizure-like shaking.  Did not notice any respiratory, GI or UTI symptoms.  Denies new medication change.   In ED, vital signs within normal. Na 147. Cr 1.83 (baseline 1.0).  BUN 46.  LA 2.1.  Hgb 10.9> 10.5.  Ammonia 14.  CT head and CXR personally reviewed without acute finding.  EKG personally reviewed features NSR at 89 with artifacts.  VBG without significant finding.  Received a liter of LR bolus.  UA, blood culture and MRI brain ordered.  Hospitalist service consulted for admission for AKI, dehydration and encephalopathy.      2/17 overnight with SB, SP.  Consulted cardiology this a.m. 2/18 eyes open today. Mumbling under his breath,unable to understand him 2/19 eyes closed. Evaluated sacral wound, foul smelling. Surgery was consulted this am  Consultants:    Procedures:   Antimicrobials:      Subjective: Not interactive with exam.   Objective: Vitals:   09/08/21 0858 09/08/21 1723 09/08/21 2039 09/09/21 0500  BP: 119/61 (!) 103/55 (!) 95/50 98/64  Pulse: 76 68 70 76  Resp: 16 16 18 16   Temp:   97.9 F (36.6 C) 99.1 F (37.3 C)  TempSrc:   Axillary Temporal  SpO2: 100% 100% 98% 98%  Height:        Intake/Output Summary (Last 24 hours) at 09/09/2021 0826 Last data filed at 09/08/2021 1856 Gross per 24 hour  Intake 0 ml  Output 700 ml  Net -700 ml   There were no vitals filed  for this visit.  Examination: Calm, NAD Cta no w/r Reg s1/s2 no gallop Soft benign +bs Cannot assess neuro . Pt eyes closed, doesn't respond to my questions No edema Mood and affect appropriate in current setting     Data Reviewed: I have personally reviewed following labs and imaging studies  CBC: Recent Labs  Lab 09/06/21 0939 09/06/21 1020 09/07/21 0401 09/09/21 0345  WBC 6.4  --  5.4 8.1  NEUTROABS 5.2  --   --   --   HGB 10.9* 10.5* 8.5* 9.5*  HCT 32.4* 31.0* 25.3* 29.0*  MCV 101.9*  --  100.8* 103.9*  PLT 246  --  199 [id number]   Basic Metabolic Panel: Recent Labs  Lab 09/06/21 0939 09/06/21 1020 09/06/21 1724 09/07/21 0401 09/09/21 0345  NA 147* 146* 145 145  --   K 4.4 4.6 4.3 4.0  --   CL 107  --  108 109  --   CO2 30  --  27 27  --   GLUCOSE 127*  --  125* 118*  --   BUN 46*  --  47* 45*  --   CREATININE 1.83*  --  1.61* 1.45* 1.11  CALCIUM 9.4  --  8.9 8.7*  --   MG  --   --   --  2.3  --   PHOS  --   --  4.1 3.9  --    GFR: CrCl cannot be calculated (Unknown ideal weight.). Liver Function Tests: Recent Labs  Lab 09/06/21 0939 09/06/21 1724 09/07/21 0401  AST 34  --  30  ALT 39  --  31  ALKPHOS 61  --  57  BILITOT 0.9  --  1.3*  PROT 7.4  --  6.2*  ALBUMIN 3.1* 2.6* 2.4*   No results for input(s): LIPASE, AMYLASE in the last 168 hours. Recent Labs  Lab 09/06/21 0939  AMMONIA 14   Coagulation Profile: No results for input(s): INR, PROTIME in the last 168 hours. Cardiac Enzymes: Recent Labs  Lab 09/06/21 1724 09/07/21 0401 09/09/21 0345  CKTOTAL 648* 524* 254   BNP (last 3 results) No results for input(s): PROBNP in the last 8760 hours. HbA1C: No results for input(s): HGBA1C in the last 72 hours. CBG: Recent Labs  Lab 09/06/21 0937  GLUCAP 112*   Lipid Profile: No results for input(s): CHOL, HDL, LDLCALC, TRIG, CHOLHDL, LDLDIRECT in the last 72 hours. Thyroid Function Tests: Recent Labs    09/06/21 1724  TSH 3.310    Anemia Panel: Recent Labs    09/07/21 0401  VITAMINB12 853  FOLATE 3.9*  FERRITIN 488*  TIBC 127*  IRON 18*  RETICCTPCT 1.6   Sepsis Labs: Recent Labs  Lab 09/06/21 0939 09/06/21 1724  LATICACIDVEN 2.1* 1.7    Recent Results (from the past 240 hour(s))  Blood Cultures (routine x 2)     Status: None (Preliminary result)   Collection Time: 09/06/21  9:39 AM   Specimen: BLOOD  Result Value Ref Range Status   Specimen Description BLOOD RIGHT ANTECUBITAL  Final   Special Requests   Final    BOTTLES DRAWN AEROBIC AND ANAEROBIC Blood Culture adequate volume   Culture   Final    NO GROWTH 3 DAYS Performed at [facility], [place] [address] [place], [place] [address]    Report Status PENDING  Incomplete  Blood Cultures (routine x 2)     Status: Abnormal (Preliminary result)   Collection Time: 09/06/21 10:41 AM   Specimen: BLOOD  Result Value Ref Range Status   Specimen Description BLOOD SITE NOT SPECIFIED  Final   Special Requests   Final    BOTTLES DRAWN AEROBIC AND ANAEROBIC Blood Culture results may not be optimal due to an excessive volume of blood received in culture bottles   Culture  Setup Time   Final    GRAM NEGATIVE RODS IN BOTH AEROBIC AND ANAEROBIC BOTTLES CRITICAL RESULT CALLED TO, READ BACK BY AND VERIFIED WITH: V BRYK,PHARMD@0615  09/07/21 [place]    Culture (A)  Final    ESCHERICHIA COLI SUSCEPTIBILITIES TO FOLLOW Performed at [facility], [address], [place],  [address]    Report Status PENDING  Incomplete  Blood Culture ID Panel (Reflexed)     Status: Abnormal   Collection Time: 09/06/21 10:41 AM  Result Value Ref Range Status   Enterococcus faecalis NOT DETECTED NOT DETECTED Final   Enterococcus Faecium NOT DETECTED NOT DETECTED Final   Listeria monocytogenes NOT DETECTED NOT DETECTED Final   Staphylococcus species NOT DETECTED NOT DETECTED Final   Staphylococcus aureus (BCID) NOT DETECTED NOT DETECTED Final   Staphylococcus  epidermidis NOT DETECTED NOT DETECTED Final   Staphylococcus lugdunensis NOT DETECTED NOT DETECTED Final   Streptococcus species NOT DETECTED NOT DETECTED Final   Streptococcus agalactiae NOT DETECTED NOT DETECTED Final   Streptococcus pneumoniae NOT DETECTED  NOT DETECTED Final   Streptococcus pyogenes NOT DETECTED NOT DETECTED Final   A.calcoaceticus-baumannii NOT DETECTED NOT DETECTED Final   Bacteroides fragilis NOT DETECTED NOT DETECTED Final   Enterobacterales DETECTED (A) NOT DETECTED Final    Comment: Enterobacterales represent a large order of gram negative bacteria, not a single organism. CRITICAL RESULT CALLED TO, READ BACK BY AND VERIFIED WITH: V BRYK,PHARMD@0615  09/07/21 [place]    Enterobacter cloacae complex NOT DETECTED NOT DETECTED Final   Escherichia coli DETECTED (A) NOT DETECTED Final    Comment: CRITICAL RESULT CALLED TO, READ BACK BY AND VERIFIED WITH: V BRYK,PHARMD@0615  09/07/21 [place]    Klebsiella aerogenes NOT DETECTED NOT DETECTED Final   Klebsiella oxytoca NOT DETECTED NOT DETECTED Final   Klebsiella pneumoniae NOT DETECTED NOT DETECTED Final   Proteus species NOT DETECTED NOT DETECTED Final   Salmonella species NOT DETECTED NOT DETECTED Final   Serratia marcescens NOT DETECTED NOT DETECTED Final   Haemophilus influenzae NOT DETECTED NOT DETECTED Final   Neisseria meningitidis NOT DETECTED NOT DETECTED Final   Pseudomonas aeruginosa NOT DETECTED NOT DETECTED Final   Stenotrophomonas maltophilia NOT DETECTED NOT DETECTED Final   Candida albicans NOT DETECTED NOT DETECTED Final   Candida auris NOT DETECTED NOT DETECTED Final   Candida glabrata NOT DETECTED NOT DETECTED Final   Candida krusei NOT DETECTED NOT DETECTED Final   Candida parapsilosis NOT DETECTED NOT DETECTED Final   Candida tropicalis NOT DETECTED NOT DETECTED Final   Cryptococcus neoformans/gattii NOT DETECTED NOT DETECTED Final   CTX-M ESBL NOT DETECTED NOT DETECTED Final   Carbapenem resistance IMP  NOT DETECTED NOT DETECTED Final   Carbapenem resistance KPC NOT DETECTED NOT DETECTED Final   Carbapenem resistance NDM NOT DETECTED NOT DETECTED Final   Carbapenem resist OXA 48 LIKE NOT DETECTED NOT DETECTED Final   Carbapenem resistance VIM NOT DETECTED NOT DETECTED Final    Comment: Performed at [facility], [address] [place], [place] [address]  Resp Panel by RT-PCR (Flu A&B, Covid) Nasopharyngeal Swab     Status: None   Collection Time: 09/06/21 11:59 AM   Specimen: Nasopharyngeal Swab; Nasopharyngeal(NP) swabs in vial transport medium  Result Value Ref Range Status   SARS Coronavirus 2 by RT PCR NEGATIVE NEGATIVE Final    Comment: (NOTE) SARS-CoV-2 target nucleic acids are NOT DETECTED.  The SARS-CoV-2 RNA is generally detectable in upper respiratory specimens during the acute phase of infection. The lowest concentration of SARS-CoV-2 viral copies this assay can detect is 138 copies/mL. A negative result does not preclude SARS-Cov-2 infection and should not be used as the sole basis for treatment or other patient management decisions. A negative result may occur with  improper specimen collection/handling, submission of specimen other than nasopharyngeal swab, presence of viral mutation(s) within the areas targeted by this assay, and inadequate number of viral copies(<138 copies/mL). A negative result must be combined with clinical observations, patient history, and epidemiological information. The expected result is Negative.  Fact Sheet for Patients:  EntrepreneurPulse.com.au  Fact Sheet for Healthcare Providers:  IncredibleEmployment.be  This test is no t yet approved or cleared by the [place] FDA and  has been authorized for detection and/or diagnosis of SARS-CoV-2 by FDA under an Emergency Use Authorization (EUA). This EUA will remain  in effect (meaning this test can be used) for the duration of the COVID-19  declaration under Section 564(b)(1) of the Act, 21 U.S.C.section 360bbb-3(b)(1), unless the authorization is terminated  or revoked sooner.  Influenza A by PCR NEGATIVE NEGATIVE Final   Influenza B by PCR NEGATIVE NEGATIVE Final    Comment: (NOTE) The Xpert Xpress SARS-CoV-2/FLU/RSV plus assay is intended as an aid in the diagnosis of influenza from Nasopharyngeal swab specimens and should not be used as a sole basis for treatment. Nasal washings and aspirates are unacceptable for Xpert Xpress SARS-CoV-2/FLU/RSV testing.  Fact Sheet for Patients: EntrepreneurPulse.com.au  Fact Sheet for Healthcare Providers: IncredibleEmployment.be  This test is not yet approved or cleared by the [place] FDA and has been authorized for detection and/or diagnosis of SARS-CoV-2 by FDA under an Emergency Use Authorization (EUA). This EUA will remain in effect (meaning this test can be used) for the duration of the COVID-19 declaration under Section 564(b)(1) of the Act, 21 U.S.C. section 360bbb-3(b)(1), unless the authorization is terminated or revoked.  Performed at [facility], [place] [address], [place], [place] [address]          Radiology Studies: No results found.      Scheduled Meds:  divalproex  250 mg Oral BID   heparin  5,000 Units Subcutaneous Q8H   sodium hypochlorite   Topical BID   Continuous Infusions:  cefTRIAXone (ROCEPHIN)  IV 2 g (09/08/21 0815)   lactated ringers 125 mL/hr at 09/09/21 0454    Assessment & Plan:   Principal Problem:   AKI with azotemia Active Problems:   Low back pain   Essential hypertension   Acute metabolic encephalopathy in patient with severe dementia and psychotic disturbance   Lactic acidosis   Normocytic anemia   Goals of care, counseling/discussion   Dystonia   Hypernatremia   Physical deconditioning   AKI with Azotemia Prerenal. 2/19 improved with ivf.  Avoid  nephrotoxic meds   Hypernatremia Most likely hypovolemic from dehydration 2/19 improved with ivf     Acute metabolic encephalopathy in patient with severe dementia and psychotic disturbance- (present on admission) Possibly due to electrolyte/renal derangement Correcting the above Continue Ativan 0.5 mg every 6 hours as needed for agitation Hold all antipsychotics 2/19 continue Depakote 250mg  bid Psychiatry following Palliative on board    Unstageable sacral Wound Foul smelling Wound nsg consulted-see note Surgery consulted, input appreicated-do not recommend any aggressive plans for wound debridement as patient will never be able to heal this wound given being bedbound, dementia and poor nutrition. Recommend conservative treatment with Santyl and hydrotherapy, no plans for surgical intervention   Normocytic anemia H&H stable     Lactic acidosis- (present on admission) Likely due to dehydration. Improved with ivf   Dystonia- (present on admission) Patient is on Invega and Haldol -Psychiatry consulted.see above   Sinus pause Now in SR Cards consulted, input appreciated Pt not great candidate for ppm    Physical deconditioning Seems he is wheelchair-bound at baseline. -PT/OT consulted   Essential hypertension- (present on admission) Normotensive. -Hold home amlodipine   Low back pain- (present on admission) Tylenol as needed           DVT prophylaxis: heparin  Code Status: DNR Family Communication: Daughter updated Disposition Plan:  Status is: inpatient The patient remains inpatient due to IV fluids and MS changes             LOS: 2 days   Time spent: 35 min    Nolberto Hanlon, MD [facility] Pager [phone]  If 7PM-7AM, please contact night-coverage 09/09/2021, 8:26 AM

## 2021-09-10 DIAGNOSIS — R7881 Bacteremia: Secondary | ICD-10-CM

## 2021-09-10 MED ORDER — ACETAMINOPHEN 325 MG PO TABS
650.0000 mg | ORAL_TABLET | Freq: Three times a day (TID) | ORAL | Status: DC
  Administered 2021-09-10 – 2021-09-13 (×4): 650 mg via ORAL
  Filled 2021-09-10 (×6): qty 2

## 2021-09-10 NOTE — Progress Notes (Signed)
PROGRESS NOTE    Walter [name]  [id number] DOB: Aug 14, 1950 DOA: 09/06/2021 PCP: Merrilee Seashore, MD    Brief Narrative:  71 year old M with history of severe dementia with psychosis, HTN and lower back pain presenting from nursing facility with unresponsive episode while sitting on wheelchair.    Patient is sleepy and wakes to voice with gentle tap on his shoulder.  He does not talk or follow command.  Per patient's daughter, he has severe dementia totally dependent for ADL's.  He was not talking or responding over the last 2 days.  He was not eating or drinking much either.  No seizure-like shaking.  Did not notice any respiratory, GI or UTI symptoms.  Denies new medication change.   In ED, vital signs within normal. Na 147. Cr 1.83 (baseline 1.0).  BUN 46.  LA 2.1.  Hgb 10.9> 10.5.  Ammonia 14.  CT head and CXR personally reviewed without acute finding.  EKG personally reviewed features NSR at 89 with artifacts.  VBG without significant finding.  Received a liter of LR bolus.  UA, blood culture and MRI brain ordered.  Hospitalist service consulted for admission for AKI, dehydration and encephalopathy.      2/17 overnight with SB, SP.  Consulted cardiology this a.m. 2/18 eyes open today. Mumbling under his breath,unable to understand him 2/19 eyes closed. Evaluated sacral wound, foul smelling. Surgery was consulted this am 2/20 palliative following.  Wife is not ready for full comfort measures yet.  Consultants:  Palliative care, surgery  Procedures:   Antimicrobials:      Subjective: This a.m. opens his eyes.  I asked him to try to eat he said yes.  His MS waxes and wanes at times.   Objective: Vitals:   09/09/21 0500 09/09/21 0906 09/09/21 2049 09/10/21 0450  BP: 98/64 111/66 (!) 119/58 115/74  Pulse: 76 72 72 71  Resp: 16 16 18 19   Temp: 99.1 F (37.3 C)  97.7 F (36.5 C) 97.7 F (36.5 C)  TempSrc: Temporal  Axillary Oral  SpO2: 98% 96% 98% 100%  Height:         Intake/Output Summary (Last 24 hours) at 09/10/2021 0848 Last data filed at 09/10/2021 0610 Gross per 24 hour  Intake 5780.22 ml  Output 800 ml  Net 4980.22 ml   There were no vitals filed for this visit.  Examination: Calm, NAD Cta no w/r Reg s1/s2 no gallop Soft benign +bs No edema, in mittens Opens his eyes, more interactive this a.m. for only few minutes  Mood and affect appropriate in current setting      Data Reviewed: I have personally reviewed following labs and imaging studies  CBC: Recent Labs  Lab 09/06/21 0939 09/06/21 1020 09/07/21 0401 09/09/21 0345  WBC 6.4  --  5.4 8.1  NEUTROABS 5.2  --   --   --   HGB 10.9* 10.5* 8.5* 9.5*  HCT 32.4* 31.0* 25.3* 29.0*  MCV 101.9*  --  100.8* 103.9*  PLT 246  --  199 [id number]   Basic Metabolic Panel: Recent Labs  Lab 09/06/21 0939 09/06/21 1020 09/06/21 1724 09/07/21 0401 09/09/21 0345  NA 147* 146* 145 145  --   K 4.4 4.6 4.3 4.0  --   CL 107  --  108 109  --   CO2 30  --  27 27  --   GLUCOSE 127*  --  125* 118*  --   BUN 46*  --  47* 45*  --  CREATININE 1.83*  --  1.61* 1.45* 1.11  CALCIUM 9.4  --  8.9 8.7*  --   MG  --   --   --  2.3  --   PHOS  --   --  4.1 3.9  --    GFR: CrCl cannot be calculated (Unknown ideal weight.). Liver Function Tests: Recent Labs  Lab 09/06/21 0939 09/06/21 1724 09/07/21 0401  AST 34  --  30  ALT 39  --  31  ALKPHOS 61  --  57  BILITOT 0.9  --  1.3*  PROT 7.4  --  6.2*  ALBUMIN 3.1* 2.6* 2.4*   No results for input(s): LIPASE, AMYLASE in the last 168 hours. Recent Labs  Lab 09/06/21 0939  AMMONIA 14   Coagulation Profile: No results for input(s): INR, PROTIME in the last 168 hours. Cardiac Enzymes: Recent Labs  Lab 09/06/21 1724 09/07/21 0401 09/09/21 0345  CKTOTAL 648* 524* 254   BNP (last 3 results) No results for input(s): PROBNP in the last 8760 hours. HbA1C: No results for input(s): HGBA1C in the last 72 hours. CBG: Recent Labs  Lab  09/06/21 0937  GLUCAP 112*   Lipid Profile: No results for input(s): CHOL, HDL, LDLCALC, TRIG, CHOLHDL, LDLDIRECT in the last 72 hours. Thyroid Function Tests: No results for input(s): TSH, T4TOTAL, FREET4, T3FREE, THYROIDAB in the last 72 hours.  Anemia Panel: No results for input(s): VITAMINB12, FOLATE, FERRITIN, TIBC, IRON, RETICCTPCT in the last 72 hours.  Sepsis Labs: Recent Labs  Lab 09/06/21 [id number] 09/06/21 1724  LATICACIDVEN 2.1* 1.7    Recent Results (from the past 240 hour(s))  Blood Cultures (routine x 2)     Status: None (Preliminary result)   Collection Time: 09/06/21  9:39 AM   Specimen: BLOOD  Result Value Ref Range Status   Specimen Description BLOOD RIGHT ANTECUBITAL  Final   Special Requests   Final    BOTTLES DRAWN AEROBIC AND ANAEROBIC Blood Culture adequate volume   Culture   Final    NO GROWTH 3 DAYS Performed at [facility], [address], [place], [place] [address]    Report Status PENDING  Incomplete  Blood Cultures (routine x 2)     Status: Abnormal (Preliminary result)   Collection Time: 09/06/21 10:41 AM   Specimen: BLOOD  Result Value Ref Range Status   Specimen Description BLOOD SITE NOT SPECIFIED  Final   Special Requests   Final    BOTTLES DRAWN AEROBIC AND ANAEROBIC Blood Culture results may not be optimal due to an excessive volume of blood received in culture bottles   Culture  Setup Time   Final    GRAM NEGATIVE RODS IN BOTH AEROBIC AND ANAEROBIC BOTTLES CRITICAL RESULT CALLED TO, READ BACK BY AND VERIFIED WITH: V BRYK,PHARMD@0615  09/07/21 [place] Performed at [facility], [address], [place], [place] [address]    Culture ESCHERICHIA COLI (A)  Final   Report Status PENDING  Incomplete   Organism ID, Bacteria ESCHERICHIA COLI  Final      Susceptibility   Escherichia coli - MIC*    AMPICILLIN <=2 SENSITIVE Sensitive     CEFAZOLIN <=4 SENSITIVE Sensitive     CEFEPIME <=0.12 SENSITIVE Sensitive     CEFTAZIDIME <=1  SENSITIVE Sensitive     CEFTRIAXONE <=0.25 SENSITIVE Sensitive     CIPROFLOXACIN <=0.25 SENSITIVE Sensitive     GENTAMICIN <=1 SENSITIVE Sensitive     IMIPENEM <=0.25 SENSITIVE Sensitive  TRIMETH/SULFA <=20 SENSITIVE Sensitive     AMPICILLIN/SULBACTAM <=2 SENSITIVE Sensitive     PIP/TAZO <=4 SENSITIVE Sensitive     * ESCHERICHIA COLI  Blood Culture ID Panel (Reflexed)     Status: Abnormal   Collection Time: 09/06/21 10:41 AM  Result Value Ref Range Status   Enterococcus faecalis NOT DETECTED NOT DETECTED Final   Enterococcus Faecium NOT DETECTED NOT DETECTED Final   Listeria monocytogenes NOT DETECTED NOT DETECTED Final   Staphylococcus species NOT DETECTED NOT DETECTED Final   Staphylococcus aureus (BCID) NOT DETECTED NOT DETECTED Final   Staphylococcus epidermidis NOT DETECTED NOT DETECTED Final   Staphylococcus lugdunensis NOT DETECTED NOT DETECTED Final   Streptococcus species NOT DETECTED NOT DETECTED Final   Streptococcus agalactiae NOT DETECTED NOT DETECTED Final   Streptococcus pneumoniae NOT DETECTED NOT DETECTED Final   Streptococcus pyogenes NOT DETECTED NOT DETECTED Final   A.calcoaceticus-baumannii NOT DETECTED NOT DETECTED Final   Bacteroides fragilis NOT DETECTED NOT DETECTED Final   Enterobacterales DETECTED (A) NOT DETECTED Final    Comment: Enterobacterales represent a large order of gram negative bacteria, not a single organism. CRITICAL RESULT CALLED TO, READ BACK BY AND VERIFIED WITH: V BRYK,PHARMD@0615  09/07/21 [place]    Enterobacter cloacae complex NOT DETECTED NOT DETECTED Final   Escherichia coli DETECTED (A) NOT DETECTED Final    Comment: CRITICAL RESULT CALLED TO, READ BACK BY AND VERIFIED WITH: V BRYK,PHARMD@0615  09/07/21 [place]    Klebsiella aerogenes NOT DETECTED NOT DETECTED Final   Klebsiella oxytoca NOT DETECTED NOT DETECTED Final   Klebsiella pneumoniae NOT DETECTED NOT DETECTED Final   Proteus species NOT DETECTED NOT DETECTED Final   Salmonella  species NOT DETECTED NOT DETECTED Final   Serratia marcescens NOT DETECTED NOT DETECTED Final   Haemophilus influenzae NOT DETECTED NOT DETECTED Final   Neisseria meningitidis NOT DETECTED NOT DETECTED Final   Pseudomonas aeruginosa NOT DETECTED NOT DETECTED Final   Stenotrophomonas maltophilia NOT DETECTED NOT DETECTED Final   Candida albicans NOT DETECTED NOT DETECTED Final   Candida auris NOT DETECTED NOT DETECTED Final   Candida glabrata NOT DETECTED NOT DETECTED Final   Candida krusei NOT DETECTED NOT DETECTED Final   Candida parapsilosis NOT DETECTED NOT DETECTED Final   Candida tropicalis NOT DETECTED NOT DETECTED Final   Cryptococcus neoformans/gattii NOT DETECTED NOT DETECTED Final   CTX-M ESBL NOT DETECTED NOT DETECTED Final   Carbapenem resistance IMP NOT DETECTED NOT DETECTED Final   Carbapenem resistance KPC NOT DETECTED NOT DETECTED Final   Carbapenem resistance NDM NOT DETECTED NOT DETECTED Final   Carbapenem resist OXA 48 LIKE NOT DETECTED NOT DETECTED Final   Carbapenem resistance VIM NOT DETECTED NOT DETECTED Final    Comment: Performed at [facility], [address] [place], [place] [address]  Resp Panel by RT-PCR (Flu A&B, Covid) Nasopharyngeal Swab     Status: None   Collection Time: 09/06/21 11:59 AM   Specimen: Nasopharyngeal Swab; Nasopharyngeal(NP) swabs in vial transport medium  Result Value Ref Range Status   SARS Coronavirus 2 by RT PCR NEGATIVE NEGATIVE Final    Comment: (NOTE) SARS-CoV-2 target nucleic acids are NOT DETECTED.  The SARS-CoV-2 RNA is generally detectable in upper respiratory specimens during the acute phase of infection. The lowest concentration of SARS-CoV-2 viral copies this assay can detect is 138 copies/mL. A negative result does not preclude SARS-Cov-2 infection and should not be used as the sole basis for treatment or other patient management decisions. A negative result  may occur with  improper specimen collection/handling,  submission of specimen other than nasopharyngeal swab, presence of viral mutation(s) within the areas targeted by this assay, and inadequate number of viral copies(<138 copies/mL). A negative result must be combined with clinical observations, patient history, and epidemiological information. The expected result is Negative.  Fact Sheet for Patients:  EntrepreneurPulse.com.au  Fact Sheet for Healthcare Providers:  IncredibleEmployment.be  This test is no t yet approved or cleared by the [place] FDA and  has been authorized for detection and/or diagnosis of SARS-CoV-2 by FDA under an Emergency Use Authorization (EUA). This EUA will remain  in effect (meaning this test can be used) for the duration of the COVID-19 declaration under Section 564(b)(1) of the Act, 21 U.S.C.section 360bbb-3(b)(1), unless the authorization is terminated  or revoked sooner.       Influenza A by PCR NEGATIVE NEGATIVE Final   Influenza B by PCR NEGATIVE NEGATIVE Final    Comment: (NOTE) The Xpert Xpress SARS-CoV-2/FLU/RSV plus assay is intended as an aid in the diagnosis of influenza from Nasopharyngeal swab specimens and should not be used as a sole basis for treatment. Nasal washings and aspirates are unacceptable for Xpert Xpress SARS-CoV-2/FLU/RSV testing.  Fact Sheet for Patients: EntrepreneurPulse.com.au  Fact Sheet for Healthcare Providers: IncredibleEmployment.be  This test is not yet approved or cleared by the [place] FDA and has been authorized for detection and/or diagnosis of SARS-CoV-2 by FDA under an Emergency Use Authorization (EUA). This EUA will remain in effect (meaning this test can be used) for the duration of the COVID-19 declaration under Section 564(b)(1) of the Act, 21 U.S.C. section 360bbb-3(b)(1), unless the authorization is terminated or revoked.  Performed at [facility], [place] [address] [place], [place] [address]          Radiology Studies: No results found.      Scheduled Meds:  collagenase   Topical Daily   divalproex  250 mg Oral BID   heparin  5,000 Units Subcutaneous Q8H   sodium hypochlorite   Topical BID   Continuous Infusions:  cefTRIAXone (ROCEPHIN)  IV 200 mL/hr at 09/09/21 1900   lactated ringers 125 mL/hr at 09/10/21 [id number]    Assessment & Plan:   Principal Problem:   AKI with azotemia Active Problems:   Low back pain   Essential hypertension   Acute metabolic encephalopathy in patient with severe dementia and psychotic disturbance   Lactic acidosis   Normocytic anemia   Goals of care, counseling/discussion   Dystonia   Hypernatremia   Physical deconditioning   AKI with Azotemia Prerenal. 2/20 improved with IV fluids now at baseline  Encourage hydration with assistant  Avoid nephrotoxic meds      Hypernatremia Most likely hypovolemic from dehydration 2/20 improved with IV fluids       Acute metabolic encephalopathy in patient with severe dementia and psychotic disturbance- (present on admission) Possibly due to electrolyte/renal derangement Correcting the above Continue Ativan 0.5 mg every 6 hours as needed for agitation Hold all antipsychotics  continue Depakote 250mg  bid Psychiatry following 2/20 palliative on board.  Family does not want full comfort measures yet.    Unstageable sacral Wound Foul smelling Wound nsg consulted-see note Surgery consulted, input appreicated-do not recommend any aggressive plans for wound debridement as patient will never be able to heal this wound given being bedbound, dementia and poor nutrition. 2/20 recommend conservative treatment with Santyl and hydrotherapy, no plans for surgical intervention  Normocytic anemia H&H stable     Lactic acidosis- (present on admission) Likely due to dehydration. Improved with ivf   Dystonia- (present on admission) Patient is  on Invega and Haldol -Psychiatry consulted.see above   Sinus pause Now in SR Cards consulted, input appreciated Pt not great candidate for ppm    Physical deconditioning Seems he is wheelchair-bound at baseline. -PT/OT consulted   Essential hypertension- (present on admission) Normotensive. -Hold home amlodipine   Low back pain- (present on admission) Tylenol as needed           DVT prophylaxis: heparin  Code Status: DNR Family Communication: None at bedside Disposition Plan:  Status is: inpatient The patient remains inpatient due to IV fluids and MS changes, in mittens needs to be off to return back to SNF             LOS: 3 days   Time spent: 35 min    Nolberto Hanlon, MD [facility] Pager [phone]  If 7PM-7AM, please contact night-coverage 09/10/2021, 8:48 AM

## 2021-09-10 NOTE — Progress Notes (Signed)
RN contacted WOC team to clarify wound care orders due to multiple orders in place for Sacral Wound Care.     Received message back from Helmut Muster RN with WOC team.  She advised per notes the surgical services wanted santyl and hydrotherapy & that she discontinued other wound care orders.  Active order for Santyl daily to Sacral wound.

## 2021-09-10 NOTE — Progress Notes (Signed)
Physical Therapy Wound Evaluation/Treatment Patient Details  Name: Walter [name] MRN: [id number] Date of Birth: 12-02-1950  Today's Date: 09/10/2021 Time: 1140-1228 Time Calculation (min): 48 min  Subjective  Subjective Assessment Subjective: Pt answers simple questions with yes/no but overall quiet during session. Patient and Family Stated Goals: None stated Date of Onset:  (Unknown) Prior Treatments: Dressing changes  Pain Score:  Pt appeared uncomfortable throughout session. PT reached out to care team regarding pain meds prior to next session.  Wound Assessment  Pressure Injury 09/06/21 Unstageable - Full thickness tissue loss in which the base of the injury is covered by slough (yellow, tan, gray, green or brown) and/or eschar (tan, brown or black) in the wound bed. healing edges (Active)  Wound Image   09/10/21 1437  Dressing Type Foam - Lift dressing to assess site every shift;Gauze (Comment);Santyl;Moist to moist 09/10/21 1437  Dressing Changed;Clean, Dry, Intact 09/10/21 1437  Dressing Change Frequency Daily 09/10/21 1437  State of Healing Eschar 09/10/21 1437  Site / Wound Assessment Pink;Red;Yellow;Black 09/10/21 1437  % Wound base Red or Granulating 25% 09/10/21 1437  % Wound base Yellow/Fibrinous Exudate 5% 09/10/21 1437  % Wound base Black/Eschar 70% 09/10/21 1437  % Wound base Other/Granulation Tissue (Comment) 0% 09/10/21 1437  Peri-wound Assessment Excoriated 09/10/21 1437  Wound Length (cm) 9 cm 09/10/21 1437  Wound Width (cm) 15 cm 09/10/21 1437  Wound Depth (cm) 0.1 cm 09/10/21 1437  Wound Surface Area (cm^2) 135 cm^2 09/10/21 1437  Wound Volume (cm^3) 13.5 cm^3 09/10/21 1437  Tunneling (cm) 0 09/10/21 1437  Undermining (cm) 0 09/10/21 1437  Margins Unattached edges (unapproximated) 09/10/21 1437  Drainage Amount Minimal 09/10/21 1437  Drainage Description Serosanguineous 09/10/21 1437  Treatment Debridement (Selective);Hydrotherapy (Pulse lavage);Packing  (Saline gauze) 09/10/21 1437   Hydrotherapy Pulsed lavage therapy - wound location: Sacrum Pulsed Lavage with Suction (psi): 8 psi Pulsed Lavage with Suction - Normal Saline Used: 1000 mL Pulsed Lavage Tip: Tip with splash shield Selective Debridement Selective Debridement - Location: Sacrum Selective Debridement - Tools Used: Forceps, Scalpel, Scissors Selective Debridement - Tissue Removed: Eschar    Wound Assessment and Plan  Wound Therapy - Assess/Plan/Recommendations Wound Therapy - Clinical Statement: This patient presents to hydrotherapy with an unstagable pressure injury to the sacrum. Debridement initiated this session. PT reached out to care team regarding pain meds prior to next hydro session. This patient will benefit from continued hydrotherapy for selective removal of unviable tissue, to decrease bioburden, and promote wound bed healing. Wound Therapy - Functional Problem List: Immobility, global weakness Factors Delaying/Impairing Wound Healing: Immobility Hydrotherapy Plan: Debridement, Dressing change, Patient/family education, Pulsatile lavage with suction Wound Therapy - Frequency: 6X / week Wound Therapy - Follow Up Recommendations: dressing changes by RN  Wound Therapy Goals- Improve the function of patient's integumentary system by progressing the wound(s) through the phases of wound healing (inflammation - proliferation - remodeling) by: Wound Therapy Goals - Improve the function of patient's integumentary system by progressing the wound(s) through the phases of wound healing by: Decrease Necrotic Tissue to: 20% Decrease Necrotic Tissue - Progress: Goal set today Increase Granulation Tissue to: 80% Increase Granulation Tissue - Progress: Goal set today Goals/treatment plan/discharge plan were made with and agreed upon by patient/family: No, Patient unable to participate in goals/treatment/discharge plan and family unavailable Time For Goal Achievement: 7  days Wound Therapy - Potential for Goals: Fair  Goals will be updated until maximal potential achieved or discharge criteria met.  Discharge criteria: when goals  achieved, discharge from hospital, MD decision/surgical intervention, no progress towards goals, refusal/missing three consecutive treatments without notification or medical reason.  GP     Charges PT Wound Care Charges $Wound Debridement up to 20 cm: < or equal to 20 cm $ Wound Debridement each add'l 20 sqcm: 5 $PT PLS Gun and Tip: 1 Supply $PT Hydrotherapy Visit: 1 Visit       Thelma Comp 09/10/2021, 2:50 PM  Rolinda Roan, PT, DPT Acute Rehabilitation Services Pager: [phone] Office: [phone]

## 2021-09-10 NOTE — Progress Notes (Signed)
Daily Progress Note   Patient Name: Walter [name]       Date: 09/10/2021 DOB: 1950/10/09  Age: 71 y.o. MRN#: [id number] Attending Physician: Lynn Ito, MD Primary Care Physician: Georgianne Fick, MD Admit Date: 09/06/2021  Reason for Consultation/Follow-up: Establishing goals of care  Patient Profile/HPI:  71 y.o. male  with past medical history of dementia with behavioral issues, HTN admitted on 09/06/2021 with altered mental status. Workup reveals acute kidney injury, dehydration, blood culture positive for E. Coli- being treated with IV antibiotics, MRI head negative for acute findings. He was also noted to have intermittent pauses with concerns for sick sinus syndrome- he is not considered a candidate for pacemaker placement. Palliative medicine consulted for GOC.   Subjective: [name] is awake- drinking ensure that is offered by RN. Appears more awake than when I saw him previously.  Chart reviewed- noted sacral wound.  Called spouse- her GOC continue to be to treat what is treatable, continue IV antibiotics.  We discussed [name]'s likely trajectory- likely recurrent infections and continued decline. Alan Ripper shares that for now she does wish to continue life prolonging interventions and is not ready to transition to comfort measures only. She is concerned about his comfort- worries that he might be in pain from his wound and requests pain medication for him.   Review of Systems  Unable to perform ROS: Dementia    Physical Exam Vitals and nursing note reviewed.  Pulmonary:     Effort: Pulmonary effort is normal.  Neurological:     Mental Status: He is alert. He is disoriented.            Vital Signs: BP 129/75    Pulse 80    Temp 98 F (36.7 C) (Axillary)    Resp 17    Ht 6'  6" (1.981 m)    SpO2 100%    BMI 26.23 kg/m  SpO2: SpO2: 100 % O2 Device: O2 Device: Room Air O2 Flow Rate: O2 Flow Rate (L/min): 0.5 L/min  Intake/output summary:  Intake/Output Summary (Last 24 hours) at 09/10/2021 1310 Last data filed at 09/10/2021 0610 Gross per 24 hour  Intake 5780.22 ml  Output 800 ml  Net 4980.22 ml   LBM: Last BM Date : 09/08/21 Baseline Weight:   Most recent weight:  Palliative Assessment/Data: PPS: 20%      Patient Active Problem List   Diagnosis Date Noted   Low back pain 09/06/2021   Essential hypertension 09/06/2021   Elevated PSA 09/06/2021   AKI with azotemia 09/06/2021   Acute metabolic encephalopathy in patient with severe dementia and psychotic disturbance 09/06/2021   Lactic acidosis 09/06/2021   Normocytic anemia 09/06/2021   Goals of care, counseling/discussion 09/06/2021   Dystonia 09/06/2021   Hypernatremia 09/06/2021   Physical deconditioning 09/06/2021    Palliative Care Assessment & Plan    Assessment/Recommendations/Plan  Continue current interventions Acetaminophen 650mg  TID for possible unexpressed pain TOC referral for outpatient Palliative to see at discharge   Code Status: DNR  Prognosis:  < 12 months  Discharge Planning: Skilled Nursing Facility for rehab with Palliative care service follow-up  Care plan was discussed with spouse and care team  Thank you for allowing the Palliative Medicine Team to assist in the care of this patient.   , AGNP-C Palliative Medicine   Please contact Palliative Medicine Team phone at [phone] for questions and concerns.
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
## 2021-09-11 DIAGNOSIS — B9620 Unspecified Escherichia coli [E. coli] as the cause of diseases classified elsewhere: Secondary | ICD-10-CM

## 2021-09-11 LAB — CULTURE, BLOOD (ROUTINE X 2)
Culture: NO GROWTH
Special Requests: ADEQUATE

## 2021-09-11 MED ORDER — GLYCOPYRROLATE 0.2 MG/ML IJ SOLN: 0.2000 mg | INTRAMUSCULAR | Status: DC | PRN

## 2021-09-11 MED ORDER — MORPHINE SULFATE (PF) 2 MG/ML IV SOLN
2.0000 mg | Freq: Four times a day (QID) | INTRAVENOUS | Status: DC
  Administered 2021-09-11 – 2021-09-13 (×6): 2 mg via INTRAVENOUS
  Filled 2021-09-11 (×6): qty 1

## 2021-09-11 MED ORDER — POLYVINYL ALCOHOL 1.4 % OP SOLN
1.0000 [drp] | Freq: Four times a day (QID) | OPHTHALMIC | Status: DC | PRN
  Filled 2021-09-11: qty 15

## 2021-09-11 MED ORDER — HALOPERIDOL 0.5 MG PO TABS
0.5000 mg | ORAL_TABLET | ORAL | Status: DC | PRN
  Filled 2021-09-11: qty 1

## 2021-09-11 MED ORDER — ONDANSETRON 4 MG PO TBDP
4.0000 mg | ORAL_TABLET | Freq: Four times a day (QID) | ORAL | Status: DC | PRN
Start: 2021-09-11 — End: 2021-09-14

## 2021-09-11 MED ORDER — ONDANSETRON HCL 4 MG/2ML IJ SOLN: 4.0000 mg | Freq: Four times a day (QID) | INTRAMUSCULAR | Status: DC | PRN

## 2021-09-11 MED ORDER — FERROUS SULFATE 325 (65 FE) MG PO TABS: 325.0000 mg | ORAL_TABLET | Freq: Every day | ORAL | Status: DC

## 2021-09-11 MED ORDER — HALOPERIDOL LACTATE 2 MG/ML PO CONC
0.5000 mg | ORAL | Status: DC | PRN
  Filled 2021-09-11: qty 0.3

## 2021-09-11 MED ORDER — GLYCOPYRROLATE 1 MG PO TABS
1.0000 mg | ORAL_TABLET | ORAL | Status: DC | PRN
  Filled 2021-09-11: qty 1

## 2021-09-11 MED ORDER — MORPHINE SULFATE (PF) 2 MG/ML IV SOLN: 2.0000 mg | INTRAVENOUS | Status: DC | PRN

## 2021-09-11 MED ORDER — BIOTENE DRY MOUTH MT LIQD: 15.0000 mL | OROMUCOSAL | Status: DC | PRN

## 2021-09-11 MED ORDER — HALOPERIDOL LACTATE 5 MG/ML IJ SOLN: 0.5000 mg | INTRAMUSCULAR | Status: DC | PRN

## 2021-09-11 MED ORDER — OXYCODONE HCL 5 MG PO TABS: 5.0000 mg | ORAL_TABLET | Freq: Three times a day (TID) | ORAL | Status: DC | PRN

## 2021-09-11 NOTE — Progress Notes (Signed)
Physical Therapy Wound Treatment and Discharge Patient Details  Name: Walter [name] MRN: [id number] Date of Birth: 04/21/1951  Today's Date: 09/11/2021 Time: 0569-7948 Time Calculation (min): 40 min  Subjective  Subjective Assessment Subjective: Pt answers simple questions with yes/no but overall quiet during session. Patient and Family Stated Goals: None stated Date of Onset:  (Unknown) Prior Treatments: Dressing changes  Pain Score:  Pt appears painful and treatment limited because of pain.  Wound Assessment  Pressure Injury 09/06/21 Unstageable - Full thickness tissue loss in which the base of the injury is covered by slough (yellow, tan, gray, green or brown) and/or eschar (tan, brown or black) in the wound bed. healing edges (Active)  Dressing Type Foam - Lift dressing to assess site every shift;Dakin's-soaked gauze;Moist to moist;Barrier Film (skin prep) 09/11/21 1341  Dressing Changed;Clean, Dry, Intact 09/11/21 1341  Dressing Change Frequency Daily 09/11/21 1341  State of Healing Eschar 09/11/21 1341  Site / Wound Assessment Bleeding;Pink;Red;Painful 09/11/21 1341  % Wound base Red or Granulating 25% 09/11/21 1341  % Wound base Yellow/Fibrinous Exudate 5% 09/11/21 1341  % Wound base Black/Eschar 70% 09/11/21 1341  % Wound base Other/Granulation Tissue (Comment) 0% 09/11/21 1341  Peri-wound Assessment Excoriated 09/11/21 1341  Wound Length (cm) 9 cm 09/10/21 1437  Wound Width (cm) 15 cm 09/10/21 1437  Wound Depth (cm) 0.1 cm 09/10/21 1437  Wound Surface Area (cm^2) 135 cm^2 09/10/21 1437  Wound Volume (cm^3) 13.5 cm^3 09/10/21 1437  Tunneling (cm) 0 09/10/21 1437  Undermining (cm) 0 09/10/21 1437  Margins Unattached edges (unapproximated) 09/11/21 1341  Drainage Amount Moderate 09/11/21 1341  Drainage Description Serosanguineous 09/11/21 1341  Treatment Debridement (Selective);Hydrotherapy (Pulse lavage);Packing (Saline gauze) 09/11/21 1341   Hydrotherapy Pulsed lavage  therapy - wound location: Sacrum Pulsed Lavage with Suction (psi): 8 psi Pulsed Lavage with Suction - Normal Saline Used: 1000 mL Pulsed Lavage Tip: Tip with splash shield Selective Debridement Selective Debridement - Location: Sacrum Selective Debridement - Tools Used: Forceps, Scalpel, Scissors Selective Debridement - Tissue Removed: Eschar    Wound Assessment and Plan  Wound Therapy - Assess/Plan/Recommendations Wound Therapy - Clinical Statement: Wound continues to be necrotic with foul odor. Dakin's used today Per Claiborne Billings, PA-C. After hydro session noted pt now comfort care. Hydrotherapy will sign off at this time. If needs change, please reconsult. Wound Therapy - Functional Problem List: Immobility, global weakness Factors Delaying/Impairing Wound Healing: Immobility Hydrotherapy Plan: Debridement, Dressing change, Patient/family education, Pulsatile lavage with suction Wound Therapy - Frequency: 6X / week Wound Therapy - Follow Up Recommendations: dressing changes by RN  Wound Therapy Goals- Improve the function of patient's integumentary system by progressing the wound(s) through the phases of wound healing (inflammation - proliferation - remodeling) by: Wound Therapy Goals - Improve the function of patient's integumentary system by progressing the wound(s) through the phases of wound healing by: Decrease Necrotic Tissue to: 20% Decrease Necrotic Tissue - Progress: Progressing toward goal Increase Granulation Tissue to: 80% Increase Granulation Tissue - Progress: Progressing toward goal Goals/treatment plan/discharge plan were made with and agreed upon by patient/family: No, Patient unable to participate in goals/treatment/discharge plan and family unavailable Time For Goal Achievement: 7 days Wound Therapy - Potential for Goals: Fair  Goals will be updated until maximal potential achieved or discharge criteria met.  Discharge criteria: when goals achieved, discharge from  hospital, MD decision/surgical intervention, no progress towards goals, refusal/missing three consecutive treatments without notification or medical reason.  GP     Charges PT Wound  Care Charges $Wound Debridement up to 20 cm: < or equal to 20 cm $ Wound Debridement each add'l 20 sqcm: 5 $PT PLS Gun and Tip: 1 Supply $PT Hydrotherapy Visit: 1 Visit       Thelma Comp 09/11/2021, 2:37 PM  Rolinda Roan, PT, DPT Acute Rehabilitation Services Pager: [phone] Office: [phone]

## 2021-09-11 NOTE — Consult Note (Signed)
WOC Nurse Consult Note: Patient is transitioning over to comfort measures per Palliative Care. All ABX and meds except for comfort meds have been discontinued.  Reason for Consult: Comfort wound care Wound type: Unstageable pressure injury Previously treating with Hydrotherapy and Dakin's x 3 days then Santyl. Comfort measures will discontinue this treatment and continue with daily dressing changes as follows: Cleanse the sacral area with no rinse cleanser, place a moistened saline gauze over the wound, dry gauze and secure with a foam dressing. Change daily or PRN soiling.   Renaldo Reel Katrinka Blazing, MSN, RN, CMSRN, Angus Seller, [facility] Wound Treatment Associate Pager [phone]

## 2021-09-11 NOTE — Progress Notes (Signed)
PROGRESS NOTE    Walter [name]  [id number] DOB: 11-01-1950 DOA: 09/06/2021 PCP: Merrilee Seashore, MD    Brief Narrative:  71 year old M with history of severe dementia with psychosis, HTN and lower back pain presenting from nursing facility with unresponsive episode while sitting on wheelchair.    Patient is sleepy and wakes to voice with gentle tap on his shoulder.  He does not talk or follow command.  Per patient's daughter, he has severe dementia totally dependent for ADL's.  He was not talking or responding over the last 2 days.  He was not eating or drinking much either.  No seizure-like shaking.  Did not notice any respiratory, GI or UTI symptoms.  Denies new medication change.   In ED, vital signs within normal. Na 147. Cr 1.83 (baseline 1.0).  BUN 46.  LA 2.1.  Hgb 10.9> 10.5.  Ammonia 14.  CT head and CXR personally reviewed without acute finding.  EKG personally reviewed features NSR at 89 with artifacts.  VBG without significant finding.  Received a liter of LR bolus.  UA, blood culture and MRI brain ordered.  Hospitalist service consulted for admission for AKI, dehydration and encephalopathy.      2/17 overnight with SB, SP.  Consulted cardiology this a.m. 2/19 eyes closed. Evaluated sacral wound, foul smelling. Surgery was consulted this am 2/21 palliative care wife has decided to make patient comfortable with hospice house  Consultants:  Palliative care, surgery  Procedures:   Antimicrobials:      Subjective: Eyes open, denies shortness of breath or chest pain.  More interactive today.  In mittens.  Objective: Vitals:   09/10/21 1737 09/10/21 2132 09/11/21 0455 09/11/21 0928  BP: (!) 115/57 111/62 (!) 113/59 115/72  Pulse: 88 75 76 83  Resp: 19 19 18 19   Temp: 98.1 F (36.7 C) 98.5 F (36.9 C) 98 F (36.7 C) 98 F (36.7 C)  TempSrc: Axillary Axillary Axillary Axillary  SpO2: 95% 100% 100% 98%  Height:        Intake/Output Summary (Last 24 hours)  at 09/11/2021 1615 Last data filed at 09/11/2021 1500 Gross per 24 hour  Intake 694.47 ml  Output 950 ml  Net -255.53 ml   There were no vitals filed for this visit.  Examination: Calm, NAD Cta no w/r Reg s1/s2 no gallop Soft benign +bs No edema Awake , alert Mood and affect appropriate in current setting     Data Reviewed: I have personally reviewed following labs and imaging studies  CBC: Recent Labs  Lab 09/06/21 0939 09/06/21 1020 09/07/21 0401 09/09/21 0345  WBC 6.4  --  5.4 8.1  NEUTROABS 5.2  --   --   --   HGB 10.9* 10.5* 8.5* 9.5*  HCT 32.4* 31.0* 25.3* 29.0*  MCV 101.9*  --  100.8* 103.9*  PLT 246  --  199 [id number]   Basic Metabolic Panel: Recent Labs  Lab 09/06/21 0939 09/06/21 1020 09/06/21 1724 09/07/21 0401 09/09/21 0345  NA 147* 146* 145 145  --   K 4.4 4.6 4.3 4.0  --   CL 107  --  108 109  --   CO2 30  --  27 27  --   GLUCOSE 127*  --  125* 118*  --   BUN 46*  --  47* 45*  --   CREATININE 1.83*  --  1.61* 1.45* 1.11  CALCIUM 9.4  --  8.9 8.7*  --   MG  --   --   --  2.3  --   PHOS  --   --  4.1 3.9  --    GFR: CrCl cannot be calculated (Unknown ideal weight.). Liver Function Tests: Recent Labs  Lab 09/06/21 0939 09/06/21 1724 09/07/21 0401  AST 34  --  30  ALT 39  --  31  ALKPHOS 61  --  57  BILITOT 0.9  --  1.3*  PROT 7.4  --  6.2*  ALBUMIN 3.1* 2.6* 2.4*   No results for input(s): LIPASE, AMYLASE in the last 168 hours. Recent Labs  Lab 09/06/21 0939  AMMONIA 14   Coagulation Profile: No results for input(s): INR, PROTIME in the last 168 hours. Cardiac Enzymes: Recent Labs  Lab 09/06/21 1724 09/07/21 0401 09/09/21 0345  CKTOTAL 648* 524* 254   BNP (last 3 results) No results for input(s): PROBNP in the last 8760 hours. HbA1C: No results for input(s): HGBA1C in the last 72 hours. CBG: Recent Labs  Lab 09/06/21 0937  GLUCAP 112*   Lipid Profile: No results for input(s): CHOL, HDL, LDLCALC, TRIG, CHOLHDL,  LDLDIRECT in the last 72 hours. Thyroid Function Tests: No results for input(s): TSH, T4TOTAL, FREET4, T3FREE, THYROIDAB in the last 72 hours.  Anemia Panel: No results for input(s): VITAMINB12, FOLATE, FERRITIN, TIBC, IRON, RETICCTPCT in the last 72 hours.  Sepsis Labs: Recent Labs  Lab 09/06/21 [id number] 09/06/21 1724  LATICACIDVEN 2.1* 1.7    Recent Results (from the past 240 hour(s))  Blood Cultures (routine x 2)     Status: None   Collection Time: 09/06/21  9:39 AM   Specimen: BLOOD  Result Value Ref Range Status   Specimen Description BLOOD RIGHT ANTECUBITAL  Final   Special Requests   Final    BOTTLES DRAWN AEROBIC AND ANAEROBIC Blood Culture adequate volume   Culture   Final    NO GROWTH 5 DAYS Performed at [facility], [address] [place], [place] [address]    Report Status 09/11/2021 FINAL  Final  Blood Cultures (routine x 2)     Status: Abnormal   Collection Time: 09/06/21 10:41 AM   Specimen: BLOOD  Result Value Ref Range Status   Specimen Description BLOOD SITE NOT SPECIFIED  Final   Special Requests   Final    BOTTLES DRAWN AEROBIC AND ANAEROBIC Blood Culture results may not be optimal due to an excessive volume of blood received in culture bottles   Culture  Setup Time   Final    GRAM NEGATIVE RODS IN BOTH AEROBIC AND ANAEROBIC BOTTLES CRITICAL RESULT CALLED TO, READ BACK BY AND VERIFIED WITH: V BRYK,PHARMD@0615  09/07/21 [place]    Culture (A)  Final    ESCHERICHIA COLI PROTEUS MIRABILIS CRITICAL RESULT CALLED TO, READ BACK BY AND VERIFIED WITH: PHARMD E.MARTIN AT JQ:[id number] ON 09/11/2021 BY T.SAAD. Performed at [facility], [place] [address], [place], [place] [address]    Report Status 09/11/2021 FINAL  Final   Organism ID, Bacteria ESCHERICHIA COLI  Final   Organism ID, Bacteria PROTEUS MIRABILIS  Final      Susceptibility   Escherichia coli - MIC*    AMPICILLIN <=2 SENSITIVE Sensitive     CEFAZOLIN <=4 SENSITIVE Sensitive     CEFEPIME <=0.12  SENSITIVE Sensitive     CEFTAZIDIME <=1 SENSITIVE Sensitive     CEFTRIAXONE <=0.25 SENSITIVE Sensitive     CIPROFLOXACIN <=0.25 SENSITIVE Sensitive     GENTAMICIN <=1 SENSITIVE Sensitive     IMIPENEM <=0.25 SENSITIVE Sensitive  TRIMETH/SULFA <=20 SENSITIVE Sensitive     AMPICILLIN/SULBACTAM <=2 SENSITIVE Sensitive     PIP/TAZO <=4 SENSITIVE Sensitive     * ESCHERICHIA COLI   Proteus mirabilis - MIC*    AMPICILLIN <=2 SENSITIVE Sensitive     CEFAZOLIN <=4 SENSITIVE Sensitive     CEFEPIME <=0.12 SENSITIVE Sensitive     CEFTAZIDIME <=1 SENSITIVE Sensitive     CEFTRIAXONE <=0.25 SENSITIVE Sensitive     CIPROFLOXACIN <=0.25 SENSITIVE Sensitive     GENTAMICIN <=1 SENSITIVE Sensitive     IMIPENEM 2 SENSITIVE Sensitive     TRIMETH/SULFA <=20 SENSITIVE Sensitive     AMPICILLIN/SULBACTAM <=2 SENSITIVE Sensitive     PIP/TAZO <=4 SENSITIVE Sensitive     * PROTEUS MIRABILIS  Blood Culture ID Panel (Reflexed)     Status: Abnormal   Collection Time: 09/06/21 10:41 AM  Result Value Ref Range Status   Enterococcus faecalis NOT DETECTED NOT DETECTED Final   Enterococcus Faecium NOT DETECTED NOT DETECTED Final   Listeria monocytogenes NOT DETECTED NOT DETECTED Final   Staphylococcus species NOT DETECTED NOT DETECTED Final   Staphylococcus aureus (BCID) NOT DETECTED NOT DETECTED Final   Staphylococcus epidermidis NOT DETECTED NOT DETECTED Final   Staphylococcus lugdunensis NOT DETECTED NOT DETECTED Final   Streptococcus species NOT DETECTED NOT DETECTED Final   Streptococcus agalactiae NOT DETECTED NOT DETECTED Final   Streptococcus pneumoniae NOT DETECTED NOT DETECTED Final   Streptococcus pyogenes NOT DETECTED NOT DETECTED Final   A.calcoaceticus-baumannii NOT DETECTED NOT DETECTED Final   Bacteroides fragilis NOT DETECTED NOT DETECTED Final   Enterobacterales DETECTED (A) NOT DETECTED Final    Comment: Enterobacterales represent a large order of gram negative bacteria, not a single  organism. CRITICAL RESULT CALLED TO, READ BACK BY AND VERIFIED WITH: V BRYK,PHARMD@0615  09/07/21 [place]    Enterobacter cloacae complex NOT DETECTED NOT DETECTED Final   Escherichia coli DETECTED (A) NOT DETECTED Final    Comment: CRITICAL RESULT CALLED TO, READ BACK BY AND VERIFIED WITH: V BRYK,PHARMD@0615  09/07/21 [place]    Klebsiella aerogenes NOT DETECTED NOT DETECTED Final   Klebsiella oxytoca NOT DETECTED NOT DETECTED Final   Klebsiella pneumoniae NOT DETECTED NOT DETECTED Final   Proteus species NOT DETECTED NOT DETECTED Final   Salmonella species NOT DETECTED NOT DETECTED Final   Serratia marcescens NOT DETECTED NOT DETECTED Final   Haemophilus influenzae NOT DETECTED NOT DETECTED Final   Neisseria meningitidis NOT DETECTED NOT DETECTED Final   Pseudomonas aeruginosa NOT DETECTED NOT DETECTED Final   Stenotrophomonas maltophilia NOT DETECTED NOT DETECTED Final   Candida albicans NOT DETECTED NOT DETECTED Final   Candida auris NOT DETECTED NOT DETECTED Final   Candida glabrata NOT DETECTED NOT DETECTED Final   Candida krusei NOT DETECTED NOT DETECTED Final   Candida parapsilosis NOT DETECTED NOT DETECTED Final   Candida tropicalis NOT DETECTED NOT DETECTED Final   Cryptococcus neoformans/gattii NOT DETECTED NOT DETECTED Final   CTX-M ESBL NOT DETECTED NOT DETECTED Final   Carbapenem resistance IMP NOT DETECTED NOT DETECTED Final   Carbapenem resistance KPC NOT DETECTED NOT DETECTED Final   Carbapenem resistance NDM NOT DETECTED NOT DETECTED Final   Carbapenem resist OXA 48 LIKE NOT DETECTED NOT DETECTED Final   Carbapenem resistance VIM NOT DETECTED NOT DETECTED Final    Comment: Performed at [facility], [address], [place], [place] [address]  Resp Panel by RT-PCR (Flu A&B, Covid) Nasopharyngeal Swab     Status: None   Collection Time: 09/06/21  11:59 AM   Specimen: Nasopharyngeal Swab; Nasopharyngeal(NP) swabs in vial transport medium  Result Value Ref Range Status    SARS Coronavirus 2 by RT PCR NEGATIVE NEGATIVE Final    Comment: (NOTE) SARS-CoV-2 target nucleic acids are NOT DETECTED.  The SARS-CoV-2 RNA is generally detectable in upper respiratory specimens during the acute phase of infection. The lowest concentration of SARS-CoV-2 viral copies this assay can detect is 138 copies/mL. A negative result does not preclude SARS-Cov-2 infection and should not be used as the sole basis for treatment or other patient management decisions. A negative result may occur with  improper specimen collection/handling, submission of specimen other than nasopharyngeal swab, presence of viral mutation(s) within the areas targeted by this assay, and inadequate number of viral copies(<138 copies/mL). A negative result must be combined with clinical observations, patient history, and epidemiological information. The expected result is Negative.  Fact Sheet for Patients:  EntrepreneurPulse.com.au  Fact Sheet for Healthcare Providers:  IncredibleEmployment.be  This test is no t yet approved or cleared by the [place] FDA and  has been authorized for detection and/or diagnosis of SARS-CoV-2 by FDA under an Emergency Use Authorization (EUA). This EUA will remain  in effect (meaning this test can be used) for the duration of the COVID-19 declaration under Section 564(b)(1) of the Act, 21 U.S.C.section 360bbb-3(b)(1), unless the authorization is terminated  or revoked sooner.       Influenza A by PCR NEGATIVE NEGATIVE Final   Influenza B by PCR NEGATIVE NEGATIVE Final    Comment: (NOTE) The Xpert Xpress SARS-CoV-2/FLU/RSV plus assay is intended as an aid in the diagnosis of influenza from Nasopharyngeal swab specimens and should not be used as a sole basis for treatment. Nasal washings and aspirates are unacceptable for Xpert Xpress SARS-CoV-2/FLU/RSV testing.  Fact Sheet for  Patients: EntrepreneurPulse.com.au  Fact Sheet for Healthcare Providers: IncredibleEmployment.be  This test is not yet approved or cleared by the [place] FDA and has been authorized for detection and/or diagnosis of SARS-CoV-2 by FDA under an Emergency Use Authorization (EUA). This EUA will remain in effect (meaning this test can be used) for the duration of the COVID-19 declaration under Section 564(b)(1) of the Act, 21 U.S.C. section 360bbb-3(b)(1), unless the authorization is terminated or revoked.  Performed at [facility], [place] [address] [place], [place] [address]          Radiology Studies: No results found.      Scheduled Meds:  acetaminophen  650 mg Oral TID   divalproex  250 mg Oral BID    morphine injection  2 mg Intravenous Q6H   Continuous Infusions:    Assessment & Plan:   Principal Problem:   AKI with azotemia Active Problems:   Low back pain   Essential hypertension   Acute metabolic encephalopathy in patient with severe dementia and psychotic disturbance   Lactic acidosis   Normocytic anemia   Goals of care, counseling/discussion   Dystonia   Hypernatremia   Physical deconditioning   AKI with Azotemia Prerenal. 2/21 improved with IV fluids, now at baseline       Hypernatremia Most likely hypovolemic from dehydration 2/21 improved with IV fluids        Acute metabolic encephalopathy in patient with severe dementia and psychotic disturbance- (present on admission) Possibly due to electrolyte/renal derangement Correcting the above Hold all antipsychotics continue Depakote 250mg  bid Psychiatry following 2/21 family has decided on comfort care with hospice facility  Unstageable sacral Wound Foul smelling Wound nsg consulted-see note Surgery consulted, input appreicated-do not recommend any aggressive plans for wound debridement as patient will never be able to heal  this wound given being bedbound, dementia and poor nutrition. - recommend conservative treatment with Santyl and hydrotherapy, no plans for surgical intervention  2/21 now transitioning to comfort measures. Was treated with hydrotherapy    Normocytic anemia H&H stable     Lactic acidosis- (present on admission) Likely due to dehydration. Improved with ivf   Dystonia- (present on admission) Patient is on Invega and Haldol -Psychiatry consulted.see above   Sinus pause Now in SR Cards consulted, input appreciated Pt not great candidate for ppm    Physical deconditioning Seems he is wheelchair-bound at baseline.    Essential hypertension- (present on admission) Normotensive. -Hold home amlodipine   Low back pain- (present on admission) Comfort measures           DVT prophylaxis: heparin  Code Status: DNR Family Communication: None at bedside Disposition Plan:  Status is: inpatient The patient remains inpatient due to unsafe discharge.  Planning on comfort measures and transferring patient to hospice facility when bed available            LOS: 4 days   Time spent: 35 min    Nolberto Hanlon, MD [facility] Pager [phone]  If 7PM-7AM, please contact night-coverage 09/11/2021, 4:15 PM

## 2021-09-11 NOTE — Progress Notes (Signed)
Daily Progress Note   Patient Name: Walter [name]       Date: 09/11/2021 DOB: 1950-10-07  Age: 71 y.o. MRN#: [id number] Attending Physician: Lynn Ito, MD Primary Care Physician: Georgianne Fick, MD Admit Date: 09/06/2021  Reason for Consultation/Follow-up: Establishing goals of care  Patient Profile/HPI:  71 y.o. male  with past medical history of advanced dementia, HTN admitted on 09/06/2021 with altered mental status. Workup reveals acute kidney injury, dehydration, blood culture positive for E. Coli, MRI head negative for acute findings. He was also noted to have intermittent cardiac pauses with concerns for sick sinus syndrome- he is not considered a candidate for pacemaker placement. Further workup shows necrotic sacral wound- possible source of bacteremia, and possible osteomyelitis. Palliative medicine consulted for GOC.   Subjective: Chart reviewed including notes, labs.  Walter [name] is not eating or drinking. He took a few bites last night with this spouse- but otherwise is not taking in adequate nutrition.  Today it takes a great amount of effort to get him to open his eyes. When he does he does not respond to any of my questions.  I called and spoke with Walter [name] again. Walter [name] asked about Palliative vs Hospice. Walter [name] shares that she saw his wound last night. She asked about Walter [name]'s prognosis.  We discussed that it is unlikely to heal with his poor nutritional status, and that with ongoing medical interventions such as hospitalization, IV fluids, and IV antibiotics- I would not be surprised if he was still with [place] in 6 months.  We discussed that focusing on his comfort and and providing Hospice care are also an option. Again reviewed that this would include stopping IV fluids, IV  antibiotics, but providing symptom relief including pain and anxiety management.  Walter [name] shares that Walter [name] would not want to just exist with this wound that is not going to heal and is going to be a continued source of infection.  She is familiar with [facility] and would like to see if he could be placed there.   Review of Systems  Unable to perform ROS: Dementia    Physical Exam Vitals and nursing note reviewed.  Constitutional:      General: He is not in acute distress.    Appearance: He is ill-appearing.  Cardiovascular:     Rate and Rhythm:  Normal rate.  Pulmonary:     Effort: Pulmonary effort is normal.  Neurological:     Mental Status: He is disoriented.     Comments: Difficult to arouse, nonverbal            Vital Signs: BP 115/72 (BP Location: Right Arm)    Pulse 83    Temp 98 F (36.7 C) (Axillary)    Resp 19    Ht 6\' 6"  (1.981 m)    SpO2 98%    BMI 26.23 kg/m  SpO2: SpO2: 98 % O2 Device: O2 Device: Room Air O2 Flow Rate: O2 Flow Rate (L/min): 0.5 L/min  Intake/output summary:  Intake/Output Summary (Last 24 hours) at 09/11/2021 1304 Last data filed at 09/11/2021 0900 Gross per 24 hour  Intake 1001.79 ml  Output 650 ml  Net 351.79 ml   LBM: Last BM Date : 09/08/21 Baseline Weight:   Most recent weight:         Palliative Assessment/Data: PPS: 20%      Patient Active Problem List   Diagnosis Date Noted   Low back pain 09/06/2021   Essential hypertension 09/06/2021   Elevated PSA 09/06/2021   AKI with azotemia 09/06/2021   Acute metabolic encephalopathy in patient with severe dementia and psychotic disturbance 09/06/2021   Lactic acidosis 09/06/2021   Normocytic anemia 09/06/2021   Goals of care, counseling/discussion 09/06/2021   Dystonia 09/06/2021   Hypernatremia 09/06/2021   Physical deconditioning 09/06/2021    Palliative Care Assessment & Plan    Assessment/Recommendations/Plan  E. Coli bacteremia, acute kidney injury, sinus pauses  in the setting of advanced dementia, large sacral wound with possible osteomyelitis- plan for transition to comfort measures only Pain from wound- morphine 2mg  q6hr IV Other symptom management:  Lorazepam 0.5mg  IV q6hr prn for anxiety or agitation Haldol .5mg  IV q4hr prn for agitation Robinul .2mg  IV q4hr prn terminal secretions D/C all other interventions and medication that are not contributing to comfort  Code Status: DNR  Prognosis:  < 2 weeks  Discharge Planning: Hospice facility  Care plan was discussed with patient's spouse- 09/08/2021 and care team  , AGNP-C Palliative Medicine   Please contact Palliative Medicine Team phone at [phone] for questions and concerns.

## 2021-09-12 DIAGNOSIS — G309 Alzheimer's disease, unspecified: Secondary | ICD-10-CM

## 2021-09-12 DIAGNOSIS — F02C2 Dementia in other diseases classified elsewhere, severe, with psychotic disturbance: Secondary | ICD-10-CM

## 2021-09-12 DIAGNOSIS — Z515 Encounter for palliative care: Secondary | ICD-10-CM

## 2021-09-12 NOTE — Progress Notes (Signed)
Daily Progress Note   Patient Name: Walter [name]       Date: 09/12/2021 DOB: 09-10-1950  Age: 71 y.o. MRN#: [id number] Attending Physician: Azucena Fallen, MD Primary Care Physician: Georgianne Fick, MD Admit Date: 09/06/2021  Reason for Consultation/Follow-up: Establishing goals of care  Patient Profile/HPI:  71 y.o. male  with past medical history of advanced dementia, HTN admitted on 09/06/2021 with altered mental status. Workup reveals acute kidney injury, dehydration, blood culture positive for E. Coli, MRI head negative for acute findings. He was also noted to have intermittent cardiac pauses with concerns for sick sinus syndrome- he is not considered a candidate for pacemaker placement. Further workup shows necrotic sacral wound- possible source of bacteremia, and possible osteomyelitis. Palliative medicine consulted for GOC.   Subjective: Chart reviewed- patient seen.  He is not eating or drinking. Today he is lying in bed, looking at ceiling. He tracks, but does not respond to any questions.   Review of Systems  Unable to perform ROS: Dementia    Physical Exam Vitals and nursing note reviewed.  Constitutional:      General: He is not in acute distress.    Appearance: He is ill-appearing.  Pulmonary:     Effort: Pulmonary effort is normal.  Neurological:     Mental Status: He is alert.     Comments: nonverbal            Vital Signs: BP 115/72 (BP Location: Right Arm)    Pulse 83    Temp 98 F (36.7 C) (Axillary)    Resp 19    Ht 6\' 6"  (1.981 m)    SpO2 98%    BMI 26.23 kg/m  SpO2: SpO2: 98 % O2 Device: O2 Device: Room Air O2 Flow Rate: O2 Flow Rate (L/min): 0.5 L/min  Intake/output summary:  Intake/Output Summary (Last 24 hours) at 09/12/2021 1558 Last data  filed at 09/12/2021 0900 Gross per 24 hour  Intake 0 ml  Output 600 ml  Net -600 ml   LBM: Last BM Date : 09/08/21 Baseline Weight:   Most recent weight:         Palliative Assessment/Data: PPS: 10%      Patient Active Problem List   Diagnosis Date Noted   Low back pain 09/06/2021   Essential hypertension 09/06/2021  Elevated PSA 09/06/2021   AKI with azotemia 09/06/2021   Acute metabolic encephalopathy in patient with severe dementia and psychotic disturbance 09/06/2021   Lactic acidosis 09/06/2021   Normocytic anemia 09/06/2021   Goals of care, counseling/discussion 09/06/2021   Dystonia 09/06/2021   Hypernatremia 09/06/2021   Physical deconditioning 09/06/2021    Palliative Care Assessment & Plan    Assessment/Recommendations/Plan  E. Coli bacteremia, dehydration, AKI in the setting of advanced dementia, necrotic sacral wound with possible osteomyelitis, not eating or drinking Plan is for comfort measures only Being evaluated for possible d/c to Hospice house Continue current interventions as ordered    Code Status: DNR  Prognosis:  < 2 weeks due to not eating or drinking, adv dementia, E. Coli bacteremia with plans for comfort measures only  Discharge Planning: Hospice facility   Thank you for allowing the Palliative Medicine Team to assist in the care of this patient.   Ocie Bob, AGNP-C Palliative Medicine   Please contact Palliative Medicine Team phone at [phone] for questions and concerns.

## 2021-09-12 NOTE — Care Management Important Message (Signed)
Important Message  Patient Details  Name: Walter [name] MRN: [id number] Date of Birth: 28-Feb-1951   Medicare Important Message Given:  Yes     Dorena Bodo 09/12/2021, 1:41 PM

## 2021-09-12 NOTE — Progress Notes (Signed)
Civil engineer, contracting [facility]) Hospital Liaison note.    Received request from [facility] manager for family interest in [facility].   Spoke with family to confirm interest and answer questions. Information forwarded for review. Liaison will follow up with eligibility and availability as soon as this is determined.   Please do not hesitate to call with questions.    Thank you,   Elsie Saas, RN, [facility]      [facility] Liaison   [phone]

## 2021-09-12 NOTE — Progress Notes (Signed)
PROGRESS NOTE    Walter [name]  [id number] DOB: [birth year] DOA: 09/06/2021 PCP: Merrilee Seashore, MD   Brief Narrative:  71 year old M with history of severe dementia with psychosis, HTN and lower back pain presenting from nursing facility with unresponsive episode while sitting on wheelchair.    2/17 overnight with SB, SP.  Consulted cardiology 2/19 eyes closed. Evaluated sacral wound, foul smelling. Surgery was consulted 2/21 palliative care wife has decided to make patient comfortable with hospice house 2/22 awaiting placement pending hospice house availability  Assessment & Plan:   Principal Problem:   AKI with azotemia Active Problems:   Low back pain   Essential hypertension   Acute metabolic encephalopathy in patient with severe dementia and psychotic disturbance   Lactic acidosis   Normocytic anemia   Goals of care, counseling/discussion   Dystonia   Hypernatremia   Physical deconditioning   GOALS OF CARE Hospice house, transition pending bed availability and further planning with palliative care and family   AKI with Azotemia Prerenal. 2/21 improved with IV fluids, now at baseline   Hypernatremia Most likely hypovolemic from dehydration 2/21 improved with IV fluids   Acute metabolic encephalopathy in patient with severe dementia and psychotic disturbance- (present on admission) Continue to focus on comfort measures   Unstageable sacral Wound continue to focus on comfort measures Normocytic anemia H&H stable Lactic acidosis- (present on admission) improved  Dystonia- (present on admission) Patient is on Invega and Haldol Sinus pause Now in SR - Pt not great candidate for ppm Physical deconditioning Seems he is wheelchair-bound at baseline. Essential hypertension- Normotensive - Hold home amlodipine Low back pain- (present on admission) Comfort measures   DVT prophylaxis: None, continue comfort measures Code Status: DNR -comfort measures  only Family Communication: None present  Status is: Inpatient  Dispo: The patient is from: Home              Anticipated d/c is to: Hospice house              Anticipated d/c date is: 24 to 48 hours pending bed availability at hospice              Patient currently is medically stable for discharge  Consultants:  Palliative care, surgery, cardiology   Antimicrobials:  Discontinued  Subjective: No acute issues or events reported overnight  Objective: Vitals:   09/10/21 1737 09/10/21 2132 09/11/21 0455 09/11/21 0928  BP: (!) 115/57 111/62 (!) 113/59 115/72  Pulse: 88 75 76 83  Resp: 19 19 18 19   Temp: 98.1 F (36.7 C) 98.5 F (36.9 C) 98 F (36.7 C) 98 F (36.7 C)  TempSrc: Axillary Axillary Axillary Axillary  SpO2: 95% 100% 100% 98%  Height:        Intake/Output Summary (Last 24 hours) at 09/12/2021 0803 Last data filed at 09/12/2021 0600 Gross per 24 hour  Intake 0 ml  Output 900 ml  Net -900 ml   There were no vitals filed for this visit.  Examination:  General exam: Resting comfortably Respiratory system: Without dyspnea or accessory muscle use Gastrointestinal system: Nondistended nontender Central nervous system: Somnolent, poorly arousable  Data Reviewed: I have personally reviewed following labs and imaging studies  CBC: Recent Labs  Lab 09/06/21 0939 09/06/21 1020 09/07/21 0401 09/09/21 0345  WBC 6.4  --  5.4 8.1  NEUTROABS 5.2  --   --   --   HGB 10.9* 10.5* 8.5* 9.5*  HCT 32.4* 31.0* 25.3* 29.0*  MCV 101.9*  --  100.8* 103.9*  PLT 246  --  199 [id number]   Basic Metabolic Panel: Recent Labs  Lab 09/06/21 0939 09/06/21 1020 09/06/21 1724 09/07/21 0401 09/09/21 0345  NA 147* 146* 145 145  --   K 4.4 4.6 4.3 4.0  --   CL 107  --  108 109  --   CO2 30  --  27 27  --   GLUCOSE 127*  --  125* 118*  --   BUN 46*  --  47* 45*  --   CREATININE 1.83*  --  1.61* 1.45* 1.11  CALCIUM 9.4  --  8.9 8.7*  --   MG  --   --   --  2.3  --   PHOS  --    --  4.1 3.9  --    GFR: CrCl cannot be calculated (Unknown ideal weight.). Liver Function Tests: Recent Labs  Lab 09/06/21 0939 09/06/21 1724 09/07/21 0401  AST 34  --  30  ALT 39  --  31  ALKPHOS 61  --  57  BILITOT 0.9  --  1.3*  PROT 7.4  --  6.2*  ALBUMIN 3.1* 2.6* 2.4*   No results for input(s): LIPASE, AMYLASE in the last 168 hours. Recent Labs  Lab 09/06/21 0939  AMMONIA 14   Coagulation Profile: No results for input(s): INR, PROTIME in the last 168 hours. Cardiac Enzymes: Recent Labs  Lab 09/06/21 1724 09/07/21 0401 09/09/21 0345  CKTOTAL 648* 524* 254   BNP (last 3 results) No results for input(s): PROBNP in the last 8760 hours. HbA1C: No results for input(s): HGBA1C in the last 72 hours. CBG: Recent Labs  Lab 09/06/21 0937  GLUCAP 112*   Lipid Profile: No results for input(s): CHOL, HDL, LDLCALC, TRIG, CHOLHDL, LDLDIRECT in the last 72 hours. Thyroid Function Tests: No results for input(s): TSH, T4TOTAL, FREET4, T3FREE, THYROIDAB in the last 72 hours. Anemia Panel: No results for input(s): VITAMINB12, FOLATE, FERRITIN, TIBC, IRON, RETICCTPCT in the last 72 hours. Sepsis Labs: Recent Labs  Lab 09/06/21 [id number] 09/06/21 1724  LATICACIDVEN 2.1* 1.7    Recent Results (from the past 240 hour(s))  Blood Cultures (routine x 2)     Status: None   Collection Time: 09/06/21  9:39 AM   Specimen: BLOOD  Result Value Ref Range Status   Specimen Description BLOOD RIGHT ANTECUBITAL  Final   Special Requests   Final    BOTTLES DRAWN AEROBIC AND ANAEROBIC Blood Culture adequate volume   Culture   Final    NO GROWTH 5 DAYS Performed at [facility], [address] [place], [place] [address]    Report Status 09/11/2021 FINAL  Final  Blood Cultures (routine x 2)     Status: Abnormal   Collection Time: 09/06/21 10:41 AM   Specimen: BLOOD  Result Value Ref Range Status   Specimen Description BLOOD SITE NOT SPECIFIED  Final   Special Requests   Final     BOTTLES DRAWN AEROBIC AND ANAEROBIC Blood Culture results may not be optimal due to an excessive volume of blood received in culture bottles   Culture  Setup Time   Final    GRAM NEGATIVE RODS IN BOTH AEROBIC AND ANAEROBIC BOTTLES CRITICAL RESULT CALLED TO, READ BACK BY AND VERIFIED WITH: V BRYK,PHARMD@0615  09/07/21 [place]    Culture (A)  Final    ESCHERICHIA COLI PROTEUS MIRABILIS CRITICAL RESULT CALLED TO, READ BACK BY AND VERIFIED WITH: PHARMD E.MARTIN AT JQ:[id number]  ON 09/11/2021 BY T.SAAD. Performed at [facility], [place] [address], [place], [place] [address]    Report Status 09/11/2021 FINAL  Final   Organism ID, Bacteria ESCHERICHIA COLI  Final   Organism ID, Bacteria PROTEUS MIRABILIS  Final      Susceptibility   Escherichia coli - MIC*    AMPICILLIN <=2 SENSITIVE Sensitive     CEFAZOLIN <=4 SENSITIVE Sensitive     CEFEPIME <=0.12 SENSITIVE Sensitive     CEFTAZIDIME <=1 SENSITIVE Sensitive     CEFTRIAXONE <=0.25 SENSITIVE Sensitive     CIPROFLOXACIN <=0.25 SENSITIVE Sensitive     GENTAMICIN <=1 SENSITIVE Sensitive     IMIPENEM <=0.25 SENSITIVE Sensitive     TRIMETH/SULFA <=20 SENSITIVE Sensitive     AMPICILLIN/SULBACTAM <=2 SENSITIVE Sensitive     PIP/TAZO <=4 SENSITIVE Sensitive     * ESCHERICHIA COLI   Proteus mirabilis - MIC*    AMPICILLIN <=2 SENSITIVE Sensitive     CEFAZOLIN <=4 SENSITIVE Sensitive     CEFEPIME <=0.12 SENSITIVE Sensitive     CEFTAZIDIME <=1 SENSITIVE Sensitive     CEFTRIAXONE <=0.25 SENSITIVE Sensitive     CIPROFLOXACIN <=0.25 SENSITIVE Sensitive     GENTAMICIN <=1 SENSITIVE Sensitive     IMIPENEM 2 SENSITIVE Sensitive     TRIMETH/SULFA <=20 SENSITIVE Sensitive     AMPICILLIN/SULBACTAM <=2 SENSITIVE Sensitive     PIP/TAZO <=4 SENSITIVE Sensitive     * PROTEUS MIRABILIS  Blood Culture ID Panel (Reflexed)     Status: Abnormal   Collection Time: 09/06/21 10:41 AM  Result Value Ref Range Status   Enterococcus faecalis NOT DETECTED NOT DETECTED  Final   Enterococcus Faecium NOT DETECTED NOT DETECTED Final   Listeria monocytogenes NOT DETECTED NOT DETECTED Final   Staphylococcus species NOT DETECTED NOT DETECTED Final   Staphylococcus aureus (BCID) NOT DETECTED NOT DETECTED Final   Staphylococcus epidermidis NOT DETECTED NOT DETECTED Final   Staphylococcus lugdunensis NOT DETECTED NOT DETECTED Final   Streptococcus species NOT DETECTED NOT DETECTED Final   Streptococcus agalactiae NOT DETECTED NOT DETECTED Final   Streptococcus pneumoniae NOT DETECTED NOT DETECTED Final   Streptococcus pyogenes NOT DETECTED NOT DETECTED Final   A.calcoaceticus-baumannii NOT DETECTED NOT DETECTED Final   Bacteroides fragilis NOT DETECTED NOT DETECTED Final   Enterobacterales DETECTED (A) NOT DETECTED Final    Comment: Enterobacterales represent a large order of gram negative bacteria, not a single organism. CRITICAL RESULT CALLED TO, READ BACK BY AND VERIFIED WITH: V BRYK,PHARMD@0615  09/07/21 [place]    Enterobacter cloacae complex NOT DETECTED NOT DETECTED Final   Escherichia coli DETECTED (A) NOT DETECTED Final    Comment: CRITICAL RESULT CALLED TO, READ BACK BY AND VERIFIED WITH: V BRYK,PHARMD@0615  09/07/21 [place]    Klebsiella aerogenes NOT DETECTED NOT DETECTED Final   Klebsiella oxytoca NOT DETECTED NOT DETECTED Final   Klebsiella pneumoniae NOT DETECTED NOT DETECTED Final   Proteus species NOT DETECTED NOT DETECTED Final   Salmonella species NOT DETECTED NOT DETECTED Final   Serratia marcescens NOT DETECTED NOT DETECTED Final   Haemophilus influenzae NOT DETECTED NOT DETECTED Final   Neisseria meningitidis NOT DETECTED NOT DETECTED Final   Pseudomonas aeruginosa NOT DETECTED NOT DETECTED Final   Stenotrophomonas maltophilia NOT DETECTED NOT DETECTED Final   Candida albicans NOT DETECTED NOT DETECTED Final   Candida auris NOT DETECTED NOT DETECTED Final   Candida glabrata NOT DETECTED NOT DETECTED Final   Candida krusei NOT DETECTED NOT  DETECTED Final  Candida parapsilosis NOT DETECTED NOT DETECTED Final   Candida tropicalis NOT DETECTED NOT DETECTED Final   Cryptococcus neoformans/gattii NOT DETECTED NOT DETECTED Final   CTX-M ESBL NOT DETECTED NOT DETECTED Final   Carbapenem resistance IMP NOT DETECTED NOT DETECTED Final   Carbapenem resistance KPC NOT DETECTED NOT DETECTED Final   Carbapenem resistance NDM NOT DETECTED NOT DETECTED Final   Carbapenem resist OXA 48 LIKE NOT DETECTED NOT DETECTED Final   Carbapenem resistance VIM NOT DETECTED NOT DETECTED Final    Comment: Performed at [facility], [place] [address], [place], [place] [address]  Resp Panel by RT-PCR (Flu A&B, Covid) Nasopharyngeal Swab     Status: None   Collection Time: 09/06/21 11:59 AM   Specimen: Nasopharyngeal Swab; Nasopharyngeal(NP) swabs in vial transport medium  Result Value Ref Range Status   SARS Coronavirus 2 by RT PCR NEGATIVE NEGATIVE Final    Comment: (NOTE) SARS-CoV-2 target nucleic acids are NOT DETECTED.  The SARS-CoV-2 RNA is generally detectable in upper respiratory specimens during the acute phase of infection. The lowest concentration of SARS-CoV-2 viral copies this assay can detect is 138 copies/mL. A negative result does not preclude SARS-Cov-2 infection and should not be used as the sole basis for treatment or other patient management decisions. A negative result may occur with  improper specimen collection/handling, submission of specimen other than nasopharyngeal swab, presence of viral mutation(s) within the areas targeted by this assay, and inadequate number of viral copies(<138 copies/mL). A negative result must be combined with clinical observations, patient history, and epidemiological information. The expected result is Negative.  Fact Sheet for Patients:  EntrepreneurPulse.com.au  Fact Sheet for Healthcare Providers:  IncredibleEmployment.be  This test is no t yet  approved or cleared by the [place] FDA and  has been authorized for detection and/or diagnosis of SARS-CoV-2 by FDA under an Emergency Use Authorization (EUA). This EUA will remain  in effect (meaning this test can be used) for the duration of the COVID-19 declaration under Section 564(b)(1) of the Act, 21 U.S.C.section 360bbb-3(b)(1), unless the authorization is terminated  or revoked sooner.       Influenza A by PCR NEGATIVE NEGATIVE Final   Influenza B by PCR NEGATIVE NEGATIVE Final    Comment: (NOTE) The Xpert Xpress SARS-CoV-2/FLU/RSV plus assay is intended as an aid in the diagnosis of influenza from Nasopharyngeal swab specimens and should not be used as a sole basis for treatment. Nasal washings and aspirates are unacceptable for Xpert Xpress SARS-CoV-2/FLU/RSV testing.  Fact Sheet for Patients: EntrepreneurPulse.com.au  Fact Sheet for Healthcare Providers: IncredibleEmployment.be  This test is not yet approved or cleared by the [place] FDA and has been authorized for detection and/or diagnosis of SARS-CoV-2 by FDA under an Emergency Use Authorization (EUA). This EUA will remain in effect (meaning this test can be used) for the duration of the COVID-19 declaration under Section 564(b)(1) of the Act, 21 U.S.C. section 360bbb-3(b)(1), unless the authorization is terminated or revoked.  Performed at [facility], [place] [address], [place], [place] [address]     Radiology Studies: No results found.  Scheduled Meds:  acetaminophen  650 mg Oral TID   divalproex  250 mg Oral BID    morphine injection  2 mg Intravenous Q6H    LOS: 5 days   Time spent: 61min  Myisha Pickerel C Bani Gianfrancesco, DO [facility]  If 7PM-7AM, please contact night-coverage [email or web address]  09/12/2021, 8:03 AM

## 2021-09-12 NOTE — Progress Notes (Signed)
Patient noted to be transitioned to comfort care and hospice house yesterday.  No hydrotherapy being performed.  No plans for surgical intervention.  We will sign off.  Letha Cape 8:25 AM 09/12/2021
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
## 2021-09-12 NOTE — Progress Notes (Signed)
[facility] ([facility]) Hospital Liaison note.   °  °Received request from TOC manager for family interest in [facility].  °  °Eligibility confirmed. Awaiting bed availability.  °  °Please do not hesitate to call with questions.   °  °Thank you,   °Mary Anne Robertson, RN, CCM      °[facility] Hospital Liaison   °[phone] °

## 2021-09-12 NOTE — Plan of Care (Signed)
  Problem: Health Behavior/Discharge Planning: Goal: Ability to manage health-related needs will improve Outcome: Not Progressing   

## 2021-09-13 MED ORDER — MORPHINE SULFATE (PF) 2 MG/ML IV SOLN
2.0000 mg | INTRAVENOUS | Status: DC
  Administered 2021-09-13 – 2021-09-14 (×8): 2 mg via INTRAVENOUS
  Filled 2021-09-13 (×8): qty 1

## 2021-09-13 NOTE — Plan of Care (Signed)
  Problem: Activity: Goal: Risk for activity intolerance will decrease Outcome: Not Progressing   

## 2021-09-13 NOTE — Progress Notes (Signed)
[employer], contracting [facility]) Hospital Liaison note.     Received request from [facility] manager for family interest in [facility].    Eligibility confirmed. Awaiting bed availability.    Please do not hesitate to call with questions.     Thank you,   Elsie Saas, RN, [facility]      [facility] Liaison   [phone]

## 2021-09-13 NOTE — Progress Notes (Signed)
Daily Progress Note   Patient Name: Walter [name]       Date: 09/13/2021 DOB: 10-Jan-1951  Age: 71 y.o. MRN#: [id number] Attending Physician: Little Ishikawa, MD Primary Care Physician: Merrilee Seashore, MD Admit Date: 09/06/2021  Reason for Consultation/Follow-up: Establishing goals of care  Patient Profile/HPI:  71 y.o. male  with past medical history of advanced dementia, HTN admitted on 09/06/2021 with altered mental status. Workup reveals acute kidney injury, dehydration, blood culture positive for E. Coli, MRI head negative for acute findings. He was also noted to have intermittent cardiac pauses with concerns for sick sinus syndrome- he is not considered a candidate for pacemaker placement. Further workup shows necrotic sacral wound- possible source of bacteremia, and possible osteomyelitis. Palliative medicine consulted for [place].   Subjective: Chart reviewed. Discussed with RN and assessed patient at the bedside. Still not eating or drinking per RN. He moves briefly in response to my voice but will not open his eyes. Appears uncomfortable, with a furrowed brow and grimace. No family present during my visit.   Review of Systems  Unable to perform ROS: Dementia    Physical Exam Vitals and nursing note reviewed.  Constitutional:      General: He is not in acute distress.    Appearance: He is ill-appearing.  Pulmonary:     Effort: Pulmonary effort is normal. No respiratory distress.  Abdominal:     General: There is no distension.     Palpations: Abdomen is soft.     Tenderness: There is no abdominal tenderness.  Skin:    General: Skin is warm and dry.  Neurological:     Comments: nonverbal            Vital Signs: BP 110/63 (BP Location: Right Arm)    Pulse (!) 103     Temp 98.5 F (36.9 C) (Oral)    Resp 19    Ht 6\' 6"  (1.981 m)    SpO2 92%    BMI 26.23 kg/m  SpO2: SpO2: 92 % O2 Device: O2 Device: Room Air O2 Flow Rate: O2 Flow Rate (L/min): 0.5 L/min  Intake/output summary:  Intake/Output Summary (Last 24 hours) at 09/13/2021 0950 Last data filed at 09/13/2021 0700 Gross per 24 hour  Intake 120 ml  Output 975 ml  Net -855 ml  LBM: Last BM Date : 09/08/21 Baseline Weight:   Most recent weight:         Palliative Assessment/Data: PPS: 10%      Patient Active Problem List   Diagnosis Date Noted   Low back pain 09/06/2021   Essential hypertension 09/06/2021   Elevated PSA 09/06/2021   AKI with azotemia 0000000   Acute metabolic encephalopathy in patient with severe dementia and psychotic disturbance 09/06/2021   Lactic acidosis 09/06/2021   Normocytic anemia 09/06/2021   Goals of care, counseling/discussion 09/06/2021   Dystonia 09/06/2021   Hypernatremia 09/06/2021   Physical deconditioning 09/06/2021    Palliative Care Assessment & Plan    Assessment/Recommendations/Plan  E. Coli bacteremia, dehydration, AKI in the setting of advanced dementia, necrotic sacral wound with possible osteomyelitis, still not eating or drinking Continue comfort measures only Awaiting bed for d/c to Hospice house and remains appropriate Increased IV morphine frequency to Q3H, continue other comfort care measures as ordered  PMT will continue to support holistically    Code Status: DNR  Prognosis:  < 2 weeks due to not eating or drinking, adv dementia, E. Coli bacteremia with plans for comfort measures only  Discharge Planning: Hospice facility  Total time: I spent 25 minutes in the care of the patient today in the above activities and documenting the encounter.   Dorthy Cooler, PA-C Palliative Medicine Team Team phone # [phone]  Thank you for allowing the Palliative Medicine Team to assist in the care of this patient.  Please utilize secure chat with additional questions, if there is no response within 30 minutes please call the above phone number.  Palliative Medicine Team providers are available by phone from 7am to 7pm daily and can be reached through the team cell phone.  Should this patient require assistance outside of these hours, please call the patient's attending physician.

## 2021-09-13 NOTE — Discharge Summary (Addendum)
Physician Discharge Summary  Walter [name] [id number] DOB: August 13, 1950 DOA: 09/06/2021  PCP: Merrilee Seashore, MD  Admit date: 09/06/2021 Discharge date: 09/14/2021  Admitted From: Home Disposition:  [facility]  Discharge Condition: Poor  CODE STATUS DNR  Brief/Interim Summary: 71 year old M with history of severe dementia with psychosis, HTN and lower back pain presenting from nursing facility with unresponsive episode while sitting on wheelchair.    2/17 overnight with SB, SP.  Consulted cardiology 2/19 eyes closed. Evaluated sacral wound, foul smelling. Surgery was consulted 2/21 palliative care wife has decided to make patient comfortable with hospice house 2/22 awaiting placement pending hospice house availability 2/23 transfer to [facility]  Discharge Diagnoses:  Principal Problem:   AKI with azotemia Active Problems:   Low back pain   Essential hypertension   Acute metabolic encephalopathy in patient with severe dementia and psychotic disturbance   Lactic acidosis   Normocytic anemia   End of life care   Dystonia   Hypernatremia   Physical deconditioning  [facility], transition pending bed availability and further planning with palliative care and family    AKI with Azotemia Prerenal. 2/21 improved with IV fluids, now at baseline    Hypernatremia Most likely hypovolemic from dehydration 2/21 improved with IV fluids    Acute metabolic encephalopathy in patient with severe dementia and psychotic disturbance- (present on admission) Continue to focus on comfort measures   Unstageable sacral Wound continue to focus on comfort measures Normocytic anemia H&H stable Lactic acidosis- (present on admission) improved  Dystonia- (present on admission) Patient is on Invega and Haldol Sinus pause Now in SR - Pt not great candidate for ppm Physical deconditioning Seems he is wheelchair-bound at baseline. Essential hypertension-  Normotensive - Hold home amlodipine Low back pain- (present on admission) Comfort measures   DVT prophylaxis: None, continue comfort measures Code Status: DNR -comfort measures only Family Communication: None present    Discharge Instructions  Discharge Instructions     Discharge patient   Complete by: As directed    Discharge disposition: 51-Hospice/Medical Facility   Discharge patient date: 09/14/2021      Allergies as of 09/14/2021   No Known Allergies      Medication List     STOP taking these medications    acetaminophen 500 MG tablet Commonly known as: TYLENOL   aluminum-magnesium hydroxide 200-200 MG/5ML suspension   amLODipine 5 MG tablet Commonly known as: NORVASC   Calmoseptine 0.44-20.6 % Oint Generic drug: Menthol-Zinc Oxide   divalproex 250 MG DR tablet Commonly known as: DEPAKOTE Replaced by: divalproex 125 MG capsule   guaiFENesin 100 MG/5ML liquid Commonly known as: ROBITUSSIN   haloperidol 5 MG tablet Commonly known as: HALDOL   loperamide 2 MG capsule Commonly known as: IMODIUM   LORazepam 0.5 MG tablet Commonly known as: ATIVAN   magnesium hydroxide 400 MG/5ML suspension Commonly known as: MILK OF MAGNESIA   memantine 10 MG tablet Commonly known as: NAMENDA   neomycin-bacitracin-polymyxin [phone] ointment   OVER THE COUNTER MEDICATION   paliperidone 3 MG 24 hr tablet Commonly known as: INVEGA       TAKE these medications    divalproex 125 MG capsule Commonly known as: DEPAKOTE SPRINKLE Take 2 capsules (250 mg total) by mouth 2 (two) times daily. Replaces: divalproex 250 MG DR tablet        No Known Allergies  Consultations: Palliative  Procedures/Studies: DG Chest 1 View  Result Date: 09/06/2021 CLINICAL DATA:  Altered  mental status.  Dementia EXAM: CHEST  1 VIEW COMPARISON:  Chest radiograph dated July 18, 2021 FINDINGS: The heart size and mediastinal contours are within normal limits. Both lungs are  clear. The visualized skeletal structures are unremarkable. IMPRESSION: No active disease. Electronically Signed   By: Keane Police D.O.   On: 09/06/2021 09:30   CT HEAD WO CONTRAST  Result Date: 09/06/2021 CLINICAL DATA:  Altered mental status. EXAM: CT HEAD WITHOUT CONTRAST TECHNIQUE: Contiguous axial images were obtained from the base of the skull through the vertex without intravenous contrast. RADIATION DOSE REDUCTION: This exam was performed according to the departmental dose-optimization program which includes automated exposure control, adjustment of the mA and/or kV according to patient size and/or use of iterative reconstruction technique. COMPARISON:  CT head dated July 18, 2021. FINDINGS: Brain: No evidence of acute infarction, hemorrhage, hydrocephalus, extra-axial collection or mass lesion/mass effect. Stable mild atrophy and chronic microvascular ischemic changes. Vascular: No hyperdense vessel or unexpected calcification. Skull: Normal. Negative for fracture or focal lesion. Sinuses/Orbits: No acute finding. Other: None. IMPRESSION: 1. No acute intracranial abnormality. Electronically Signed   By: Titus Dubin M.D.   On: 09/06/2021 11:39   MR BRAIN WO CONTRAST  Result Date: 09/06/2021 CLINICAL DATA:  Unresponsiveness. History of dementia and psychosis, hypertension EXAM: MRI HEAD WITHOUT CONTRAST TECHNIQUE: Multiplanar, multiecho pulse sequences of the brain and surrounding structures were obtained without intravenous contrast. COMPARISON:  Same-day noncontrast CT head FINDINGS: Brain: There is no evidence of acute intracranial hemorrhage, extra-axial fluid collection, or acute infarct. There is mild global parenchymal volume loss with prominence of the ventricular system and extra-axial CSF spaces. Patchy FLAIR signal abnormality in the subcortical and periventricular white matter likely reflects sequela of mild chronic white matter microangiopathy. There is no suspicious parenchymal  signal abnormality. There is no mass lesion. There is no midline shift. Vascular: Normal flow voids. Skull and upper cervical spine: Normal marrow signal. Sinuses/Orbits: The paranasal sinuses are clear. The globes and orbits are unremarkable. Other: None. IMPRESSION: No acute intracranial pathology. Electronically Signed   By: Valetta Mole M.D.   On: 09/06/2021 15:02     Subjective: No acute issues/events overnight   Discharge Exam: Vitals:   09/14/21 0420 09/14/21 0925  BP: (!) 132/99 126/77  Pulse: 74 85  Resp: 20 18  Temp: 98 F (36.7 C) 98 F (36.7 C)  SpO2:  93%   Vitals:   09/12/21 2002 09/12/21 2043 09/14/21 0420 09/14/21 0925  BP: 110/63  (!) 132/99 126/77  Pulse: (!) 103  74 85  Resp: 19  20 18   Temp: 98.5 F (36.9 C)  98 F (36.7 C) 98 F (36.7 C)  TempSrc: Oral  Oral Axillary  SpO2: (!) 82% 92%  93%  Height:        General: Pt is not in acute distress Cardiovascular: RRR, S1/S2 +, no rubs, no gallops Respiratory: CTA bilaterally, no wheezing, no rhonchi Abdominal: Soft, NT, ND, bowel sounds + Extremities: no edema, no cyanosis    The results of significant diagnostics from this hospitalization (including imaging, microbiology, ancillary and laboratory) are listed below for reference.     Microbiology: Recent Results (from the past 240 hour(s))  Blood Cultures (routine x 2)     Status: None   Collection Time: 09/06/21  9:39 AM   Specimen: BLOOD  Result Value Ref Range Status   Specimen Description BLOOD RIGHT ANTECUBITAL  Final   Special Requests   Final    BOTTLES  DRAWN AEROBIC AND ANAEROBIC Blood Culture adequate volume   Culture   Final    NO GROWTH 5 DAYS Performed at [facility], [place] [address], [place], [place] [address]    Report Status 09/11/2021 FINAL  Final  Blood Cultures (routine x 2)     Status: Abnormal   Collection Time: 09/06/21 10:41 AM   Specimen: BLOOD  Result Value Ref Range Status   Specimen Description BLOOD SITE NOT  SPECIFIED  Final   Special Requests   Final    BOTTLES DRAWN AEROBIC AND ANAEROBIC Blood Culture results may not be optimal due to an excessive volume of blood received in culture bottles   Culture  Setup Time   Final    GRAM NEGATIVE RODS IN BOTH AEROBIC AND ANAEROBIC BOTTLES CRITICAL RESULT CALLED TO, READ BACK BY AND VERIFIED WITH: V BRYK,PHARMD@0615  09/07/21 [place]    Culture (A)  Final    ESCHERICHIA COLI PROTEUS MIRABILIS CRITICAL RESULT CALLED TO, READ BACK BY AND VERIFIED WITH: PHARMD E.MARTIN AT DY:[id number] ON 09/11/2021 BY T.SAAD. Performed at [facility], [place] [address] [place], [place] [address]    Report Status 09/11/2021 FINAL  Final   Organism ID, Bacteria ESCHERICHIA COLI  Final   Organism ID, Bacteria PROTEUS MIRABILIS  Final      Susceptibility   Escherichia coli - MIC*    AMPICILLIN <=2 SENSITIVE Sensitive     CEFAZOLIN <=4 SENSITIVE Sensitive     CEFEPIME <=0.12 SENSITIVE Sensitive     CEFTAZIDIME <=1 SENSITIVE Sensitive     CEFTRIAXONE <=0.25 SENSITIVE Sensitive     CIPROFLOXACIN <=0.25 SENSITIVE Sensitive     GENTAMICIN <=1 SENSITIVE Sensitive     IMIPENEM <=0.25 SENSITIVE Sensitive     TRIMETH/SULFA <=20 SENSITIVE Sensitive     AMPICILLIN/SULBACTAM <=2 SENSITIVE Sensitive     PIP/TAZO <=4 SENSITIVE Sensitive     * ESCHERICHIA COLI   Proteus mirabilis - MIC*    AMPICILLIN <=2 SENSITIVE Sensitive     CEFAZOLIN <=4 SENSITIVE Sensitive     CEFEPIME <=0.12 SENSITIVE Sensitive     CEFTAZIDIME <=1 SENSITIVE Sensitive     CEFTRIAXONE <=0.25 SENSITIVE Sensitive     CIPROFLOXACIN <=0.25 SENSITIVE Sensitive     GENTAMICIN <=1 SENSITIVE Sensitive     IMIPENEM 2 SENSITIVE Sensitive     TRIMETH/SULFA <=20 SENSITIVE Sensitive     AMPICILLIN/SULBACTAM <=2 SENSITIVE Sensitive     PIP/TAZO <=4 SENSITIVE Sensitive     * PROTEUS MIRABILIS  Blood Culture ID Panel (Reflexed)     Status: Abnormal   Collection Time: 09/06/21 10:41 AM  Result Value Ref Range Status    Enterococcus faecalis NOT DETECTED NOT DETECTED Final   Enterococcus Faecium NOT DETECTED NOT DETECTED Final   Listeria monocytogenes NOT DETECTED NOT DETECTED Final   Staphylococcus species NOT DETECTED NOT DETECTED Final   Staphylococcus aureus (BCID) NOT DETECTED NOT DETECTED Final   Staphylococcus epidermidis NOT DETECTED NOT DETECTED Final   Staphylococcus lugdunensis NOT DETECTED NOT DETECTED Final   Streptococcus species NOT DETECTED NOT DETECTED Final   Streptococcus agalactiae NOT DETECTED NOT DETECTED Final   Streptococcus pneumoniae NOT DETECTED NOT DETECTED Final   Streptococcus pyogenes NOT DETECTED NOT DETECTED Final   A.calcoaceticus-baumannii NOT DETECTED NOT DETECTED Final   Bacteroides fragilis NOT DETECTED NOT DETECTED Final   Enterobacterales DETECTED (A) NOT DETECTED Final    Comment: Enterobacterales represent a large order of gram negative bacteria, not a single organism. CRITICAL RESULT CALLED  TO, READ BACK BY AND VERIFIED WITH: V BRYK,PHARMD@0615  09/07/21 [place]    Enterobacter cloacae complex NOT DETECTED NOT DETECTED Final   Escherichia coli DETECTED (A) NOT DETECTED Final    Comment: CRITICAL RESULT CALLED TO, READ BACK BY AND VERIFIED WITH: V BRYK,PHARMD@0615  09/07/21 [place]    Klebsiella aerogenes NOT DETECTED NOT DETECTED Final   Klebsiella oxytoca NOT DETECTED NOT DETECTED Final   Klebsiella pneumoniae NOT DETECTED NOT DETECTED Final   Proteus species NOT DETECTED NOT DETECTED Final   Salmonella species NOT DETECTED NOT DETECTED Final   Serratia marcescens NOT DETECTED NOT DETECTED Final   Haemophilus influenzae NOT DETECTED NOT DETECTED Final   Neisseria meningitidis NOT DETECTED NOT DETECTED Final   Pseudomonas aeruginosa NOT DETECTED NOT DETECTED Final   Stenotrophomonas maltophilia NOT DETECTED NOT DETECTED Final   Candida albicans NOT DETECTED NOT DETECTED Final   Candida auris NOT DETECTED NOT DETECTED Final   Candida glabrata NOT DETECTED NOT DETECTED  Final   Candida krusei NOT DETECTED NOT DETECTED Final   Candida parapsilosis NOT DETECTED NOT DETECTED Final   Candida tropicalis NOT DETECTED NOT DETECTED Final   Cryptococcus neoformans/gattii NOT DETECTED NOT DETECTED Final   CTX-M ESBL NOT DETECTED NOT DETECTED Final   Carbapenem resistance IMP NOT DETECTED NOT DETECTED Final   Carbapenem resistance KPC NOT DETECTED NOT DETECTED Final   Carbapenem resistance NDM NOT DETECTED NOT DETECTED Final   Carbapenem resist OXA 48 LIKE NOT DETECTED NOT DETECTED Final   Carbapenem resistance VIM NOT DETECTED NOT DETECTED Final    Comment: Performed at [facility], [address], [place], [place] [address]  Resp Panel by RT-PCR (Flu A&B, Covid) Nasopharyngeal Swab     Status: None   Collection Time: 09/06/21 11:59 AM   Specimen: Nasopharyngeal Swab; Nasopharyngeal(NP) swabs in vial transport medium  Result Value Ref Range Status   SARS Coronavirus 2 by RT PCR NEGATIVE NEGATIVE Final    Comment: (NOTE) SARS-CoV-2 target nucleic acids are NOT DETECTED.  The SARS-CoV-2 RNA is generally detectable in upper respiratory specimens during the acute phase of infection. The lowest concentration of SARS-CoV-2 viral copies this assay can detect is 138 copies/mL. A negative result does not preclude SARS-Cov-2 infection and should not be used as the sole basis for treatment or other patient management decisions. A negative result may occur with  improper specimen collection/handling, submission of specimen other than nasopharyngeal swab, presence of viral mutation(s) within the areas targeted by this assay, and inadequate number of viral copies(<138 copies/mL). A negative result must be combined with clinical observations, patient history, and epidemiological information. The expected result is Negative.  Fact Sheet for Patients:  EntrepreneurPulse.com.au  Fact Sheet for Healthcare Providers:   IncredibleEmployment.be  This test is no t yet approved or cleared by the [place] FDA and  has been authorized for detection and/or diagnosis of SARS-CoV-2 by FDA under an Emergency Use Authorization (EUA). This EUA will remain  in effect (meaning this test can be used) for the duration of the COVID-19 declaration under Section 564(b)(1) of the Act, 21 U.S.C.section 360bbb-3(b)(1), unless the authorization is terminated  or revoked sooner.       Influenza A by PCR NEGATIVE NEGATIVE Final   Influenza B by PCR NEGATIVE NEGATIVE Final    Comment: (NOTE) The Xpert Xpress SARS-CoV-2/FLU/RSV plus assay is intended as an aid in the diagnosis of influenza from Nasopharyngeal swab specimens and should not be used as a sole basis for treatment.  Nasal washings and aspirates are unacceptable for Xpert Xpress SARS-CoV-2/FLU/RSV testing.  Fact Sheet for Patients: BloggerCourse.com  Fact Sheet for Healthcare Providers: SeriousBroker.it  This test is not yet approved or cleared by the [place] FDA and has been authorized for detection and/or diagnosis of SARS-CoV-2 by FDA under an Emergency Use Authorization (EUA). This EUA will remain in effect (meaning this test can be used) for the duration of the COVID-19 declaration under Section 564(b)(1) of the Act, 21 U.S.C. section 360bbb-3(b)(1), unless the authorization is terminated or revoked.  Performed at [facility], [address] [place], [place] [address]      Labs: BNP (last 3 results) Recent Labs    09/06/21 0939  BNP 28.2   Basic Metabolic Panel: Recent Labs  Lab 09/09/21 0345  CREATININE 1.11   Liver Function Tests: No results for input(s): AST, ALT, ALKPHOS, BILITOT, PROT, ALBUMIN in the last 168 hours.  No results for input(s): LIPASE, AMYLASE in the last 168 hours. No results for input(s): AMMONIA in the last 168  hours.  CBC: Recent Labs  Lab 09/09/21 0345  WBC 8.1  HGB 9.5*  HCT 29.0*  MCV 103.9*  PLT 198   Cardiac Enzymes: Recent Labs  Lab 09/09/21 0345  CKTOTAL 254   BNP: Invalid input(s): POCBNP CBG: No results for input(s): GLUCAP in the last 168 hours.  D-Dimer No results for input(s): DDIMER in the last 72 hours. Hgb A1c No results for input(s): HGBA1C in the last 72 hours. Lipid Profile No results for input(s): CHOL, HDL, LDLCALC, TRIG, CHOLHDL, LDLDIRECT in the last 72 hours. Thyroid function studies No results for input(s): TSH, T4TOTAL, T3FREE, THYROIDAB in the last 72 hours.  Invalid input(s): FREET3 Anemia work up No results for input(s): VITAMINB12, FOLATE, FERRITIN, TIBC, IRON, RETICCTPCT in the last 72 hours. Urinalysis    Component Value Date/Time   COLORURINE YELLOW 08/02/2021 2326   APPEARANCEUR CLEAR 08/02/2021 2326   LABSPEC 1.026 08/02/2021 2326   PHURINE 5.0 08/02/2021 2326   GLUCOSEU NEGATIVE 08/02/2021 2326   HGBUR SMALL (A) 08/02/2021 2326   BILIRUBINUR NEGATIVE 08/02/2021 2326   KETONESUR 5 (A) 08/02/2021 2326   PROTEINUR NEGATIVE 08/02/2021 2326   NITRITE NEGATIVE 08/02/2021 2326   LEUKOCYTESUR NEGATIVE 08/02/2021 2326   Sepsis Labs Invalid input(s): PROCALCITONIN,  WBC,  LACTICIDVEN Microbiology Recent Results (from the past 240 hour(s))  Blood Cultures (routine x 2)     Status: None   Collection Time: 09/06/21  9:39 AM   Specimen: BLOOD  Result Value Ref Range Status   Specimen Description BLOOD RIGHT ANTECUBITAL  Final   Special Requests   Final    BOTTLES DRAWN AEROBIC AND ANAEROBIC Blood Culture adequate volume   Culture   Final    NO GROWTH 5 DAYS Performed at [facility], [address] [place], [place] [address]    Report Status 09/11/2021 FINAL  Final  Blood Cultures (routine x 2)     Status: Abnormal   Collection Time: 09/06/21 10:41 AM   Specimen: BLOOD  Result Value Ref Range Status   Specimen Description  BLOOD SITE NOT SPECIFIED  Final   Special Requests   Final    BOTTLES DRAWN AEROBIC AND ANAEROBIC Blood Culture results may not be optimal due to an excessive volume of blood received in culture bottles   Culture  Setup Time   Final    GRAM NEGATIVE RODS IN BOTH AEROBIC AND ANAEROBIC BOTTLES CRITICAL RESULT CALLED TO,  READ BACK BY AND VERIFIED WITH: V BRYK,PHARMD@0615  09/07/21 [place]    Culture (A)  Final    ESCHERICHIA COLI PROTEUS MIRABILIS CRITICAL RESULT CALLED TO, READ BACK BY AND VERIFIED WITH: PHARMD E.MARTIN AT DY:[id number] ON 09/11/2021 BY T.SAAD. Performed at [facility], [place] [address] [place], [place] [address]    Report Status 09/11/2021 FINAL  Final   Organism ID, Bacteria ESCHERICHIA COLI  Final   Organism ID, Bacteria PROTEUS MIRABILIS  Final      Susceptibility   Escherichia coli - MIC*    AMPICILLIN <=2 SENSITIVE Sensitive     CEFAZOLIN <=4 SENSITIVE Sensitive     CEFEPIME <=0.12 SENSITIVE Sensitive     CEFTAZIDIME <=1 SENSITIVE Sensitive     CEFTRIAXONE <=0.25 SENSITIVE Sensitive     CIPROFLOXACIN <=0.25 SENSITIVE Sensitive     GENTAMICIN <=1 SENSITIVE Sensitive     IMIPENEM <=0.25 SENSITIVE Sensitive     TRIMETH/SULFA <=20 SENSITIVE Sensitive     AMPICILLIN/SULBACTAM <=2 SENSITIVE Sensitive     PIP/TAZO <=4 SENSITIVE Sensitive     * ESCHERICHIA COLI   Proteus mirabilis - MIC*    AMPICILLIN <=2 SENSITIVE Sensitive     CEFAZOLIN <=4 SENSITIVE Sensitive     CEFEPIME <=0.12 SENSITIVE Sensitive     CEFTAZIDIME <=1 SENSITIVE Sensitive     CEFTRIAXONE <=0.25 SENSITIVE Sensitive     CIPROFLOXACIN <=0.25 SENSITIVE Sensitive     GENTAMICIN <=1 SENSITIVE Sensitive     IMIPENEM 2 SENSITIVE Sensitive     TRIMETH/SULFA <=20 SENSITIVE Sensitive     AMPICILLIN/SULBACTAM <=2 SENSITIVE Sensitive     PIP/TAZO <=4 SENSITIVE Sensitive     * PROTEUS MIRABILIS  Blood Culture ID Panel (Reflexed)     Status: Abnormal   Collection Time: 09/06/21 10:41 AM  Result Value Ref Range  Status   Enterococcus faecalis NOT DETECTED NOT DETECTED Final   Enterococcus Faecium NOT DETECTED NOT DETECTED Final   Listeria monocytogenes NOT DETECTED NOT DETECTED Final   Staphylococcus species NOT DETECTED NOT DETECTED Final   Staphylococcus aureus (BCID) NOT DETECTED NOT DETECTED Final   Staphylococcus epidermidis NOT DETECTED NOT DETECTED Final   Staphylococcus lugdunensis NOT DETECTED NOT DETECTED Final   Streptococcus species NOT DETECTED NOT DETECTED Final   Streptococcus agalactiae NOT DETECTED NOT DETECTED Final   Streptococcus pneumoniae NOT DETECTED NOT DETECTED Final   Streptococcus pyogenes NOT DETECTED NOT DETECTED Final   A.calcoaceticus-baumannii NOT DETECTED NOT DETECTED Final   Bacteroides fragilis NOT DETECTED NOT DETECTED Final   Enterobacterales DETECTED (A) NOT DETECTED Final    Comment: Enterobacterales represent a large order of gram negative bacteria, not a single organism. CRITICAL RESULT CALLED TO, READ BACK BY AND VERIFIED WITH: V BRYK,PHARMD@0615  09/07/21 [place]    Enterobacter cloacae complex NOT DETECTED NOT DETECTED Final   Escherichia coli DETECTED (A) NOT DETECTED Final    Comment: CRITICAL RESULT CALLED TO, READ BACK BY AND VERIFIED WITH: V BRYK,PHARMD@0615  09/07/21 [place]    Klebsiella aerogenes NOT DETECTED NOT DETECTED Final   Klebsiella oxytoca NOT DETECTED NOT DETECTED Final   Klebsiella pneumoniae NOT DETECTED NOT DETECTED Final   Proteus species NOT DETECTED NOT DETECTED Final   Salmonella species NOT DETECTED NOT DETECTED Final   Serratia marcescens NOT DETECTED NOT DETECTED Final   Haemophilus influenzae NOT DETECTED NOT DETECTED Final   Neisseria meningitidis NOT DETECTED NOT DETECTED Final   Pseudomonas aeruginosa NOT DETECTED NOT DETECTED Final   Stenotrophomonas maltophilia NOT DETECTED NOT DETECTED Final  Candida albicans NOT DETECTED NOT DETECTED Final   Candida auris NOT DETECTED NOT DETECTED Final   Candida glabrata NOT DETECTED  NOT DETECTED Final   Candida krusei NOT DETECTED NOT DETECTED Final   Candida parapsilosis NOT DETECTED NOT DETECTED Final   Candida tropicalis NOT DETECTED NOT DETECTED Final   Cryptococcus neoformans/gattii NOT DETECTED NOT DETECTED Final   CTX-M ESBL NOT DETECTED NOT DETECTED Final   Carbapenem resistance IMP NOT DETECTED NOT DETECTED Final   Carbapenem resistance KPC NOT DETECTED NOT DETECTED Final   Carbapenem resistance NDM NOT DETECTED NOT DETECTED Final   Carbapenem resist OXA 48 LIKE NOT DETECTED NOT DETECTED Final   Carbapenem resistance VIM NOT DETECTED NOT DETECTED Final    Comment: Performed at [facility], [address], [place], [place] [address]  Resp Panel by RT-PCR (Flu A&B, Covid) Nasopharyngeal Swab     Status: None   Collection Time: 09/06/21 11:59 AM   Specimen: Nasopharyngeal Swab; Nasopharyngeal(NP) swabs in vial transport medium  Result Value Ref Range Status   SARS Coronavirus 2 by RT PCR NEGATIVE NEGATIVE Final    Comment: (NOTE) SARS-CoV-2 target nucleic acids are NOT DETECTED.  The SARS-CoV-2 RNA is generally detectable in upper respiratory specimens during the acute phase of infection. The lowest concentration of SARS-CoV-2 viral copies this assay can detect is 138 copies/mL. A negative result does not preclude SARS-Cov-2 infection and should not be used as the sole basis for treatment or other patient management decisions. A negative result may occur with  improper specimen collection/handling, submission of specimen other than nasopharyngeal swab, presence of viral mutation(s) within the areas targeted by this assay, and inadequate number of viral copies(<138 copies/mL). A negative result must be combined with clinical observations, patient history, and epidemiological information. The expected result is Negative.  Fact Sheet for Patients:  EntrepreneurPulse.com.au  Fact Sheet for Healthcare Providers:   IncredibleEmployment.be  This test is no t yet approved or cleared by the [place] FDA and  has been authorized for detection and/or diagnosis of SARS-CoV-2 by FDA under an Emergency Use Authorization (EUA). This EUA will remain  in effect (meaning this test can be used) for the duration of the COVID-19 declaration under Section 564(b)(1) of the Act, 21 U.S.C.section 360bbb-3(b)(1), unless the authorization is terminated  or revoked sooner.       Influenza A by PCR NEGATIVE NEGATIVE Final   Influenza B by PCR NEGATIVE NEGATIVE Final    Comment: (NOTE) The Xpert Xpress SARS-CoV-2/FLU/RSV plus assay is intended as an aid in the diagnosis of influenza from Nasopharyngeal swab specimens and should not be used as a sole basis for treatment. Nasal washings and aspirates are unacceptable for Xpert Xpress SARS-CoV-2/FLU/RSV testing.  Fact Sheet for Patients: EntrepreneurPulse.com.au  Fact Sheet for Healthcare Providers: IncredibleEmployment.be  This test is not yet approved or cleared by the [place] FDA and has been authorized for detection and/or diagnosis of SARS-CoV-2 by FDA under an Emergency Use Authorization (EUA). This EUA will remain in effect (meaning this test can be used) for the duration of the COVID-19 declaration under Section 564(b)(1) of the Act, 21 U.S.C. section 360bbb-3(b)(1), unless the authorization is terminated or revoked.  Performed at [facility], [place] [address], [place], [place] [address]      Time coordinating discharge: Over 30 minutes  SIGNED:   Little Ishikawa, DO [facility] 09/14/2021, 11:08 AM Pager   If 7PM-7AM, please contact night-coverage [email or web address]

## 2021-09-13 NOTE — Progress Notes (Signed)
PROGRESS NOTE    Walter [name]  [id number] DOB: 19-May-1951 DOA: 09/06/2021 PCP: Merrilee Seashore, MD   Brief Narrative:  71 year old M with history of severe dementia with psychosis, HTN and lower back pain presenting from nursing facility with unresponsive episode while sitting on wheelchair.    2/17 overnight with SB, SP.  Consulted cardiology 2/19 eyes closed. Evaluated sacral wound, foul smelling. Surgery was consulted 2/21 palliative care wife has decided to make patient comfortable with hospice house 2/22-23 awaiting placement pending hospice house availability  Assessment & Plan:   Principal Problem:   AKI with azotemia Active Problems:   Low back pain   Essential hypertension   Acute metabolic encephalopathy in patient with severe dementia and psychotic disturbance   Lactic acidosis   Normocytic anemia   End of life care   Dystonia   Hypernatremia   Physical deconditioning   [facility], transition pending bed availability and further planning with palliative care and family   AKI with Azotemia Prerenal. 2/21 improved with IV fluids, now at baseline   Hypernatremia Most likely hypovolemic from dehydration 2/21 improved with IV fluids   Acute metabolic encephalopathy in patient with severe dementia and psychotic disturbance- (present on admission) Continue to focus on comfort measures   Unstageable sacral Wound continue to focus on comfort measures Normocytic anemia H&H stable Lactic acidosis- (present on admission) improved  Dystonia- (present on admission) Patient is on Invega and Haldol Sinus pause Now in SR - Pt not great candidate for ppm Physical deconditioning Seems he is wheelchair-bound at baseline. Essential hypertension- Normotensive - Hold home amlodipine Low back pain- (present on admission) Comfort measures   DVT prophylaxis: None, continue comfort measures Code Status: DNR -comfort measures only Family Communication:  None present  Status is: Inpatient  Dispo: The patient is from: Home              Anticipated d/c is to: Hospice house              Anticipated d/c date is: 24 to 48 hours pending bed availability at hospice              Patient currently is medically stable for discharge  Consultants:  Palliative care, surgery, cardiology   Antimicrobials:  Discontinued  Subjective: No acute issues or events reported overnight  Objective: Vitals:   09/11/21 0928 09/12/21 1644 09/12/21 2002 09/12/21 2043  BP: 115/72 132/64 110/63   Pulse: 83 74 (!) 103   Resp: 19 19 19    Temp: 98 F (36.7 C) 98.6 F (37 C) 98.5 F (36.9 C)   TempSrc: Axillary  Oral   SpO2: 98% 93% (!) 82% 92%  Height:        Intake/Output Summary (Last 24 hours) at 09/13/2021 1214 Last data filed at 09/13/2021 0700 Gross per 24 hour  Intake 120 ml  Output 650 ml  Net -530 ml    There were no vitals filed for this visit.  Examination:  General exam: Resting comfortably Respiratory system: Without dyspnea or accessory muscle use Gastrointestinal system: Nondistended nontender Central nervous system: Somnolent, poorly arousable  Data Reviewed: I have personally reviewed following labs and imaging studies  CBC: Recent Labs  Lab 09/07/21 0401 09/09/21 0345  WBC 5.4 8.1  HGB 8.5* 9.5*  HCT 25.3* 29.0*  MCV 100.8* 103.9*  PLT 199 [id number]    Basic Metabolic Panel: Recent Labs  Lab 09/06/21 1724 09/07/21 0401 09/09/21 0345  NA 145 145  --  K 4.3 4.0  --   CL 108 109  --   CO2 27 27  --   GLUCOSE 125* 118*  --   BUN 47* 45*  --   CREATININE 1.61* 1.45* 1.11  CALCIUM 8.9 8.7*  --   MG  --  2.3  --   PHOS 4.1 3.9  --     GFR: CrCl cannot be calculated (Unknown ideal weight.). Liver Function Tests: Recent Labs  Lab 09/06/21 1724 09/07/21 0401  AST  --  30  ALT  --  31  ALKPHOS  --  57  BILITOT  --  1.3*  PROT  --  6.2*  ALBUMIN 2.6* 2.4*    No results for input(s): LIPASE, AMYLASE in the  last 168 hours. No results for input(s): AMMONIA in the last 168 hours.  Coagulation Profile: No results for input(s): INR, PROTIME in the last 168 hours. Cardiac Enzymes: Recent Labs  Lab 09/06/21 1724 09/07/21 0401 09/09/21 0345  CKTOTAL 648* 524* 254    BNP (last 3 results) No results for input(s): PROBNP in the last 8760 hours. HbA1C: No results for input(s): HGBA1C in the last 72 hours. CBG: No results for input(s): GLUCAP in the last 168 hours.  Lipid Profile: No results for input(s): CHOL, HDL, LDLCALC, TRIG, CHOLHDL, LDLDIRECT in the last 72 hours. Thyroid Function Tests: No results for input(s): TSH, T4TOTAL, FREET4, T3FREE, THYROIDAB in the last 72 hours. Anemia Panel: No results for input(s): VITAMINB12, FOLATE, FERRITIN, TIBC, IRON, RETICCTPCT in the last 72 hours. Sepsis Labs: Recent Labs  Lab 09/06/21 1724  LATICACIDVEN 1.7     Recent Results (from the past 240 hour(s))  Blood Cultures (routine x 2)     Status: None   Collection Time: 09/06/21  9:39 AM   Specimen: BLOOD  Result Value Ref Range Status   Specimen Description BLOOD RIGHT ANTECUBITAL  Final   Special Requests   Final    BOTTLES DRAWN AEROBIC AND ANAEROBIC Blood Culture adequate volume   Culture   Final    NO GROWTH 5 DAYS Performed at [facility], [address], [place], [place] [address]    Report Status 09/11/2021 FINAL  Final  Blood Cultures (routine x 2)     Status: Abnormal   Collection Time: 09/06/21 10:41 AM   Specimen: BLOOD  Result Value Ref Range Status   Specimen Description BLOOD SITE NOT SPECIFIED  Final   Special Requests   Final    BOTTLES DRAWN AEROBIC AND ANAEROBIC Blood Culture results may not be optimal due to an excessive volume of blood received in culture bottles   Culture  Setup Time   Final    GRAM NEGATIVE RODS IN BOTH AEROBIC AND ANAEROBIC BOTTLES CRITICAL RESULT CALLED TO, READ BACK BY AND VERIFIED WITH: V BRYK,PHARMD@0615  09/07/21 [place]    Culture  (A)  Final    ESCHERICHIA COLI PROTEUS MIRABILIS CRITICAL RESULT CALLED TO, READ BACK BY AND VERIFIED WITH: PHARMD E.MARTIN AT [id number] ON 09/11/2021 BY T.SAAD. Performed at [facility], [place] [address], [place], [place] [address]    Report Status 09/11/2021 FINAL  Final   Organism ID, Bacteria ESCHERICHIA COLI  Final   Organism ID, Bacteria PROTEUS MIRABILIS  Final      Susceptibility   Escherichia coli - MIC*    AMPICILLIN <=2 SENSITIVE Sensitive     CEFAZOLIN <=4 SENSITIVE Sensitive     CEFEPIME <=0.12 SENSITIVE Sensitive     CEFTAZIDIME <=  1 SENSITIVE Sensitive     CEFTRIAXONE <=0.25 SENSITIVE Sensitive     CIPROFLOXACIN <=0.25 SENSITIVE Sensitive     GENTAMICIN <=1 SENSITIVE Sensitive     IMIPENEM <=0.25 SENSITIVE Sensitive     TRIMETH/SULFA <=20 SENSITIVE Sensitive     AMPICILLIN/SULBACTAM <=2 SENSITIVE Sensitive     PIP/TAZO <=4 SENSITIVE Sensitive     * ESCHERICHIA COLI   Proteus mirabilis - MIC*    AMPICILLIN <=2 SENSITIVE Sensitive     CEFAZOLIN <=4 SENSITIVE Sensitive     CEFEPIME <=0.12 SENSITIVE Sensitive     CEFTAZIDIME <=1 SENSITIVE Sensitive     CEFTRIAXONE <=0.25 SENSITIVE Sensitive     CIPROFLOXACIN <=0.25 SENSITIVE Sensitive     GENTAMICIN <=1 SENSITIVE Sensitive     IMIPENEM 2 SENSITIVE Sensitive     TRIMETH/SULFA <=20 SENSITIVE Sensitive     AMPICILLIN/SULBACTAM <=2 SENSITIVE Sensitive     PIP/TAZO <=4 SENSITIVE Sensitive     * PROTEUS MIRABILIS  Blood Culture ID Panel (Reflexed)     Status: Abnormal   Collection Time: 09/06/21 10:41 AM  Result Value Ref Range Status   Enterococcus faecalis NOT DETECTED NOT DETECTED Final   Enterococcus Faecium NOT DETECTED NOT DETECTED Final   Listeria monocytogenes NOT DETECTED NOT DETECTED Final   Staphylococcus species NOT DETECTED NOT DETECTED Final   Staphylococcus aureus (BCID) NOT DETECTED NOT DETECTED Final   Staphylococcus epidermidis NOT DETECTED NOT DETECTED Final   Staphylococcus lugdunensis NOT DETECTED  NOT DETECTED Final   Streptococcus species NOT DETECTED NOT DETECTED Final   Streptococcus agalactiae NOT DETECTED NOT DETECTED Final   Streptococcus pneumoniae NOT DETECTED NOT DETECTED Final   Streptococcus pyogenes NOT DETECTED NOT DETECTED Final   A.calcoaceticus-baumannii NOT DETECTED NOT DETECTED Final   Bacteroides fragilis NOT DETECTED NOT DETECTED Final   Enterobacterales DETECTED (A) NOT DETECTED Final    Comment: Enterobacterales represent a large order of gram negative bacteria, not a single organism. CRITICAL RESULT CALLED TO, READ BACK BY AND VERIFIED WITH: V BRYK,PHARMD@0615  09/07/21 [place]    Enterobacter cloacae complex NOT DETECTED NOT DETECTED Final   Escherichia coli DETECTED (A) NOT DETECTED Final    Comment: CRITICAL RESULT CALLED TO, READ BACK BY AND VERIFIED WITH: V BRYK,PHARMD@0615  09/07/21 [place]    Klebsiella aerogenes NOT DETECTED NOT DETECTED Final   Klebsiella oxytoca NOT DETECTED NOT DETECTED Final   Klebsiella pneumoniae NOT DETECTED NOT DETECTED Final   Proteus species NOT DETECTED NOT DETECTED Final   Salmonella species NOT DETECTED NOT DETECTED Final   Serratia marcescens NOT DETECTED NOT DETECTED Final   Haemophilus influenzae NOT DETECTED NOT DETECTED Final   Neisseria meningitidis NOT DETECTED NOT DETECTED Final   Pseudomonas aeruginosa NOT DETECTED NOT DETECTED Final   Stenotrophomonas maltophilia NOT DETECTED NOT DETECTED Final   Candida albicans NOT DETECTED NOT DETECTED Final   Candida auris NOT DETECTED NOT DETECTED Final   Candida glabrata NOT DETECTED NOT DETECTED Final   Candida krusei NOT DETECTED NOT DETECTED Final   Candida parapsilosis NOT DETECTED NOT DETECTED Final   Candida tropicalis NOT DETECTED NOT DETECTED Final   Cryptococcus neoformans/gattii NOT DETECTED NOT DETECTED Final   CTX-M ESBL NOT DETECTED NOT DETECTED Final   Carbapenem resistance IMP NOT DETECTED NOT DETECTED Final   Carbapenem resistance KPC NOT DETECTED NOT DETECTED  Final   Carbapenem resistance NDM NOT DETECTED NOT DETECTED Final   Carbapenem resist OXA 48 LIKE NOT DETECTED NOT DETECTED Final   Carbapenem resistance VIM NOT DETECTED NOT  DETECTED Final    Comment: Performed at [facility], [place] [address], [place], [place] [address]  Resp Panel by RT-PCR (Flu A&B, Covid) Nasopharyngeal Swab     Status: None   Collection Time: 09/06/21 11:59 AM   Specimen: Nasopharyngeal Swab; Nasopharyngeal(NP) swabs in vial transport medium  Result Value Ref Range Status   SARS Coronavirus 2 by RT PCR NEGATIVE NEGATIVE Final    Comment: (NOTE) SARS-CoV-2 target nucleic acids are NOT DETECTED.  The SARS-CoV-2 RNA is generally detectable in upper respiratory specimens during the acute phase of infection. The lowest concentration of SARS-CoV-2 viral copies this assay can detect is 138 copies/mL. A negative result does not preclude SARS-Cov-2 infection and should not be used as the sole basis for treatment or other patient management decisions. A negative result may occur with  improper specimen collection/handling, submission of specimen other than nasopharyngeal swab, presence of viral mutation(s) within the areas targeted by this assay, and inadequate number of viral copies(<138 copies/mL). A negative result must be combined with clinical observations, patient history, and epidemiological information. The expected result is Negative.  Fact Sheet for Patients:  EntrepreneurPulse.com.au  Fact Sheet for Healthcare Providers:  IncredibleEmployment.be  This test is no t yet approved or cleared by the [place] FDA and  has been authorized for detection and/or diagnosis of SARS-CoV-2 by FDA under an Emergency Use Authorization (EUA). This EUA will remain  in effect (meaning this test can be used) for the duration of the COVID-19 declaration under Section 564(b)(1) of the Act, 21 U.S.C.section 360bbb-3(b)(1), unless the  authorization is terminated  or revoked sooner.       Influenza A by PCR NEGATIVE NEGATIVE Final   Influenza B by PCR NEGATIVE NEGATIVE Final    Comment: (NOTE) The Xpert Xpress SARS-CoV-2/FLU/RSV plus assay is intended as an aid in the diagnosis of influenza from Nasopharyngeal swab specimens and should not be used as a sole basis for treatment. Nasal washings and aspirates are unacceptable for Xpert Xpress SARS-CoV-2/FLU/RSV testing.  Fact Sheet for Patients: EntrepreneurPulse.com.au  Fact Sheet for Healthcare Providers: IncredibleEmployment.be  This test is not yet approved or cleared by the [place] FDA and has been authorized for detection and/or diagnosis of SARS-CoV-2 by FDA under an Emergency Use Authorization (EUA). This EUA will remain in effect (meaning this test can be used) for the duration of the COVID-19 declaration under Section 564(b)(1) of the Act, 21 U.S.C. section 360bbb-3(b)(1), unless the authorization is terminated or revoked.  Performed at [facility], [place] [address] [place],  [address]      Radiology Studies: No results found.  Scheduled Meds:  acetaminophen  650 mg Oral TID   divalproex  250 mg Oral BID    morphine injection  2 mg Intravenous Q3H    LOS: 6 days   Time spent: 23min  Ivelis Norgard C Makenzie Weisner, DO [facility]  If 7PM-7AM, please contact night-coverage [email or web address]  09/13/2021, 12:14 PM

## 2021-09-14 MED ORDER — DIVALPROEX SODIUM 125 MG PO CSDR: 250.0000 mg | DELAYED_RELEASE_CAPSULE | Freq: Two times a day (BID) | ORAL | 0 refills | Status: AC

## 2021-09-14 NOTE — Progress Notes (Signed)
[facility] ([facility]) Hospital Liaison note.   This patient is approved to transfer to [facility] today.   Please arrange transport.    RN please call report to [phone].   Thank you,     Mary Anne Robertson, RN, CCM       [facility] Hospital Liaison  [phone] 

## 2021-09-14 NOTE — Progress Notes (Signed)
Report given to RN of [facility] place, RN said to leave IV.

## 2021-09-14 NOTE — TOC Transition Note (Signed)
Transition of Care [facility]) - CM/SW Discharge Note   Patient Details  Name: Walter [name] MRN: [id number] Date of Birth: 01-15-1951  [facility]) CM/SW Contact:  Ralene Bathe, LCSWA Phone Number: 09/14/2021, 12:33 PM   Clinical Narrative:    Patient will DC to:  [facility] Anticipated DC date:  09/14/2021 Family notified: Yes Transport by:  Sharin Mons   Per MD patient ready for DC to residential hospice. RN to call report prior to discharge [phone]). RN, patient, patient's family, and facility notified of DC. Discharge Summary and FL2 sent to facility. DC packet on chart. Ambulance transport requested for patient.   CSW will sign off for now as social work intervention is no longer needed. Please consult [place] again if new needs arise.     Final next level of care: Home w Hospice Care Barriers to Discharge: Barriers Resolved   Patient Goals and CMS Choice   CMS Medicare.gov Compare Post Acute Care list provided to:: Patient Represenative (must comment) Choice offered to / list presented to : Spouse  Discharge Placement              Patient [facility] bed at:  [facility]) Patient to be transferred to facility by: [facility] Name of family member notified: Key, Cen (Spouse)   [phone] Patient and family notified of of transfer: 09/14/21  Discharge Plan and Services                                     Social Determinants of Health (SDOH) Interventions     Readmission Risk Interventions No flowsheet data found.

## 2021-09-18 DIAGNOSIS — F03C2 Unspecified dementia, severe, with psychotic disturbance: Secondary | ICD-10-CM | POA: Diagnosis not present

## 2021-09-18 DIAGNOSIS — S31010D Laceration without foreign body of lower back and pelvis without penetration into retroperitoneum, subsequent encounter: Secondary | ICD-10-CM | POA: Diagnosis not present

## 2021-09-18 DIAGNOSIS — F03C18 Unspecified dementia, severe, with other behavioral disturbance: Secondary | ICD-10-CM | POA: Diagnosis not present

## 2021-09-18 DIAGNOSIS — F03C11 Unspecified dementia, severe, with agitation: Secondary | ICD-10-CM | POA: Diagnosis not present

## 2021-09-19 DEATH — deceased

## 2023-07-27 IMAGING — CT CT HEAD W/O CM
3 of 4 series · 14 of 47 positions shown, 16 images · non-contrast
Comparison: CT head dated July 18, 2021.

CLINICAL DATA: Altered mental status.



[Series 6: head 5.0 mpr ax · axial · 0.45mm/px · z∈[-370,-222]mm · 8 of 36 slices shown, 10 images]
[im 3/36  brain]
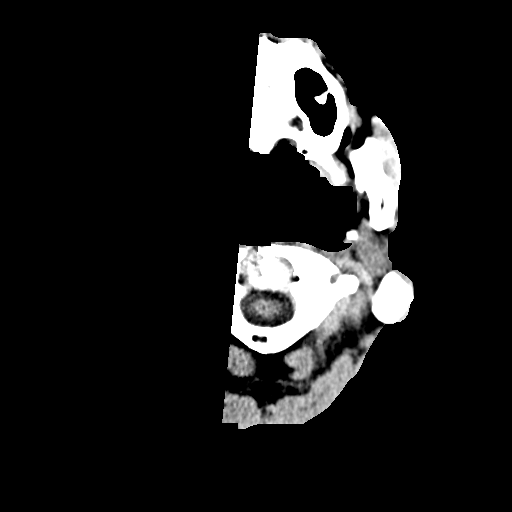
[im 3/36  bone]
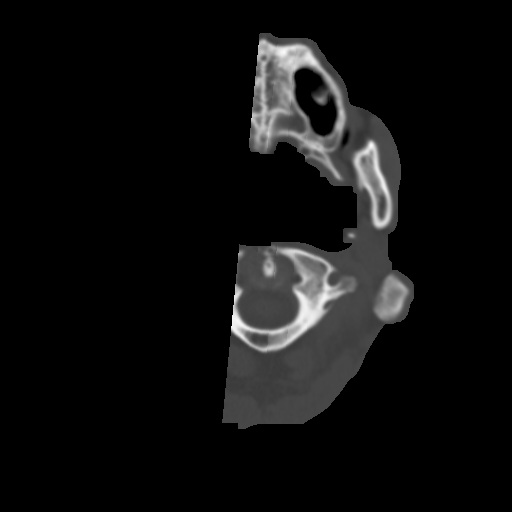
[im 8/36  brain]
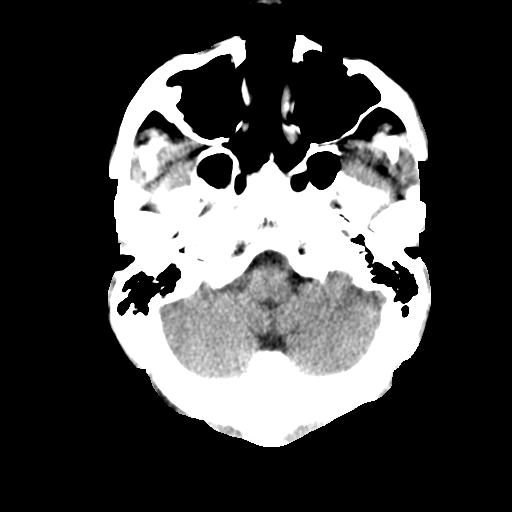
[im 12/36  brain]
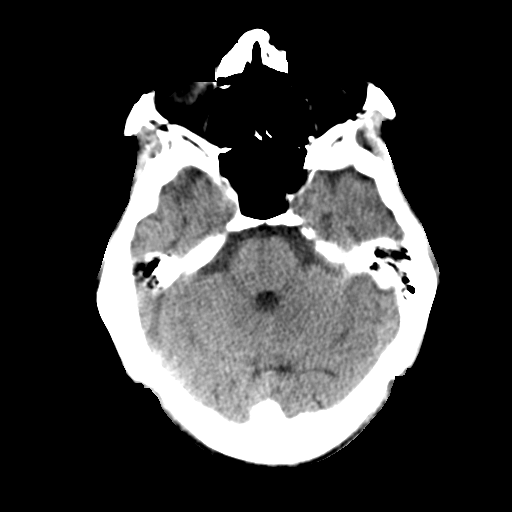
[im 17/36  brain]
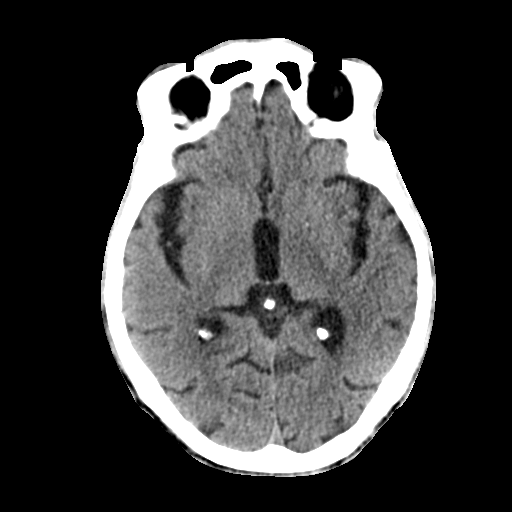
[im 19/36  brain]
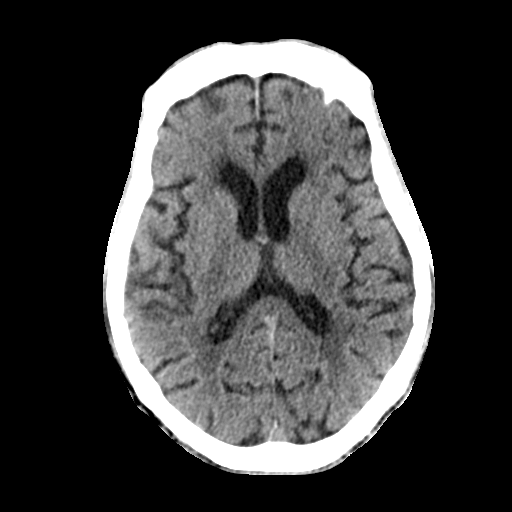
[im 19/36  bone]
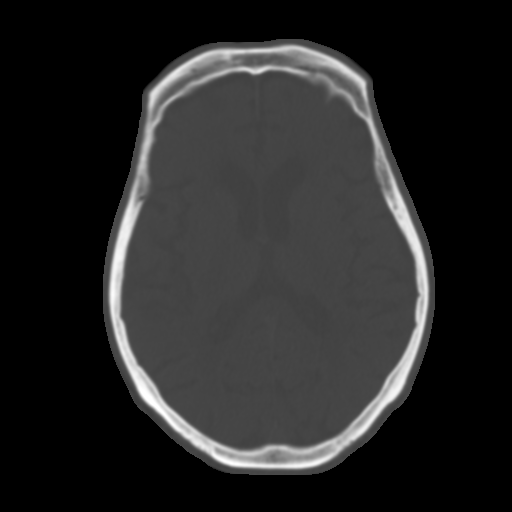
[im 24/36  brain]
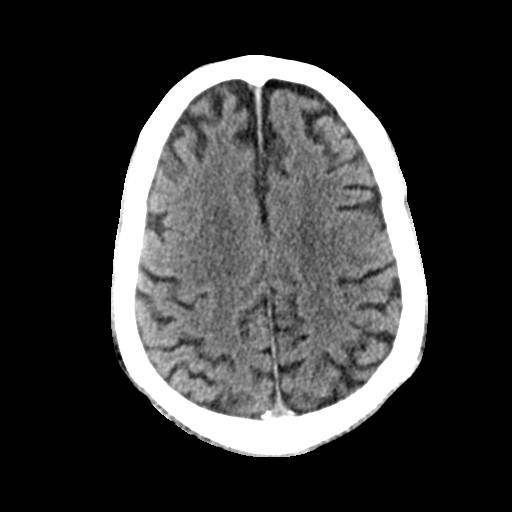
[im 29/36  brain]
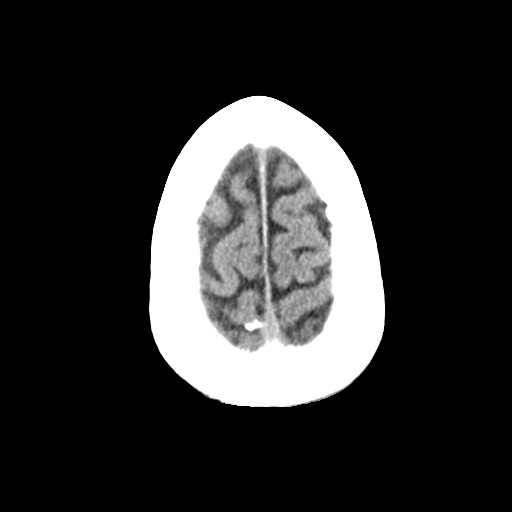
[im 33/36  brain]
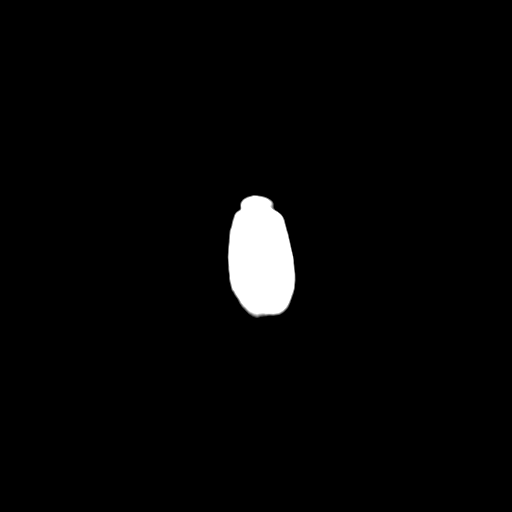

[Series 7: head 3.0 mpr cor · coronal · 0.33mm/px · 3 of 72 slices shown]
[im 24/72  brain]
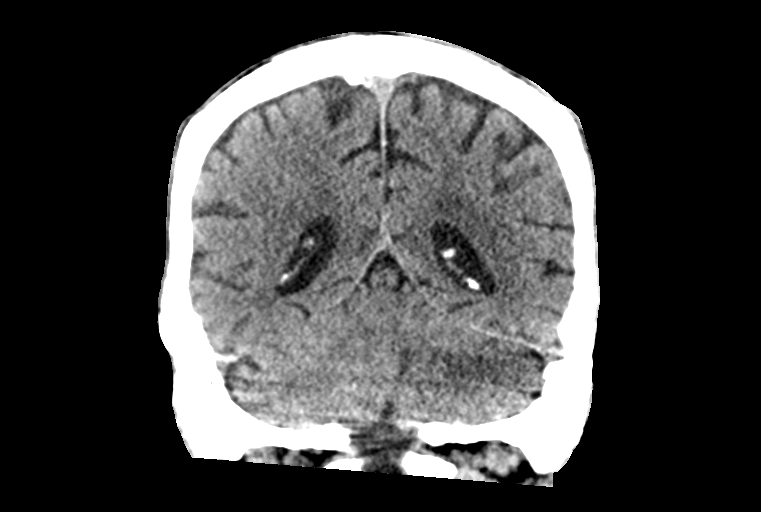
[im 32/72  brain]
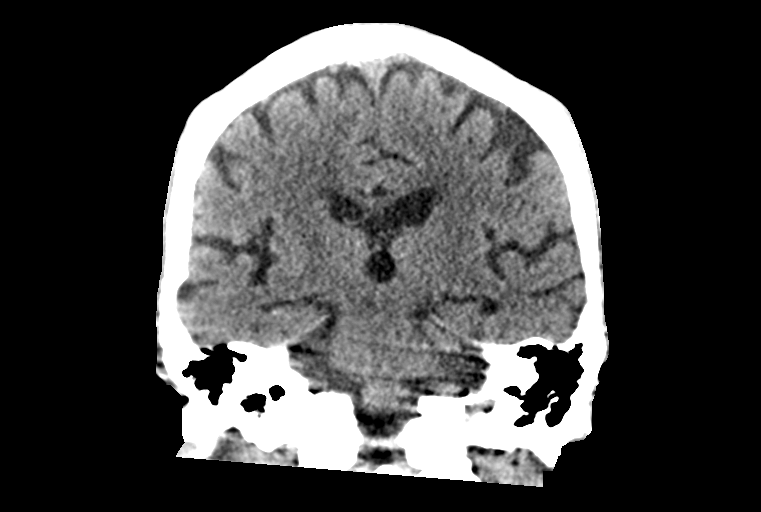
[im 40/72  brain]
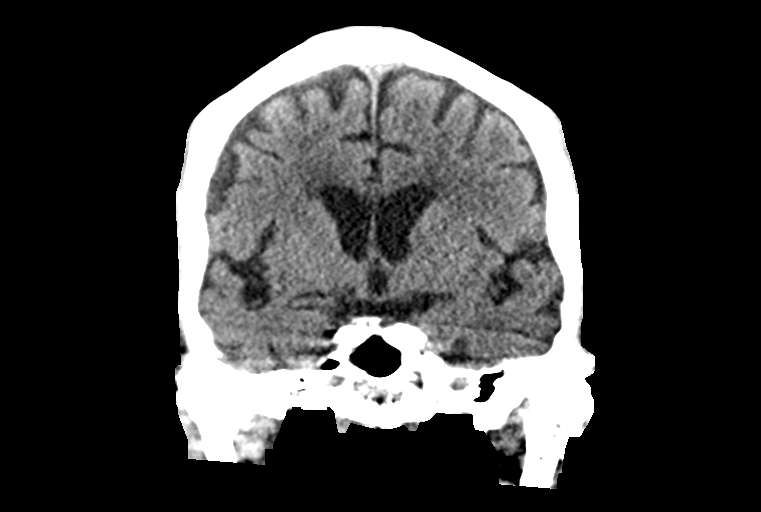

[Series 8: head 3.0 mpr sag · sagittal · 0.33mm/px · 3 of 67 slices shown]
[im 25/67  brain]
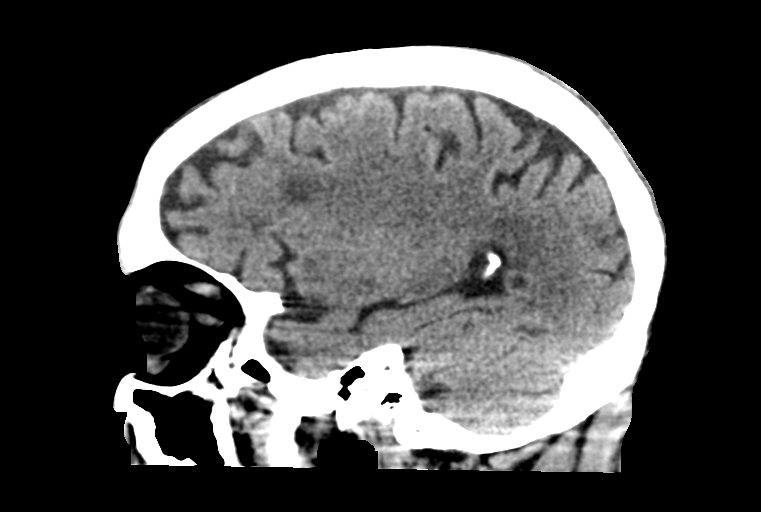
[im 34/67  brain]
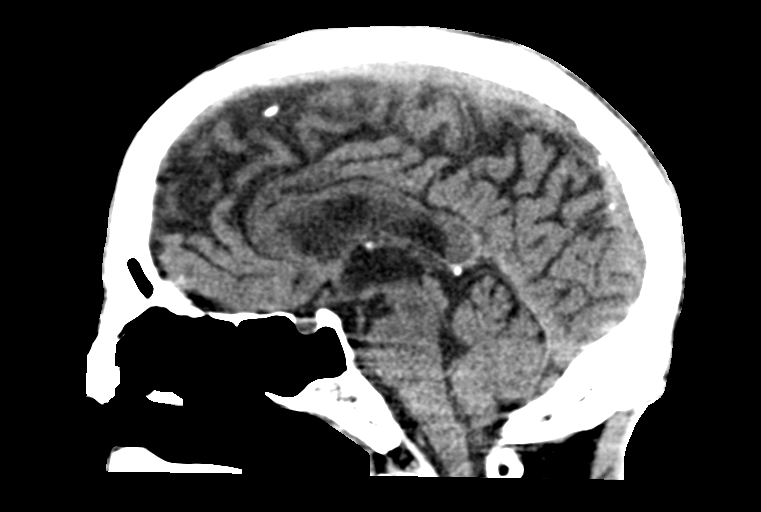
[im 42/67  brain]
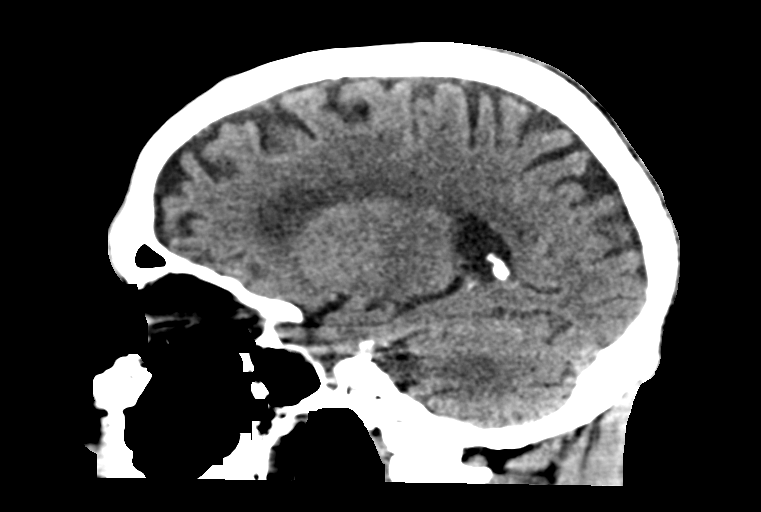

[14 of 47 positions shown; findings below may reference images not displayed]

FINDINGS: Brain: No evidence of acute infarction, hemorrhage, hydrocephalus,
extra-axial collection or mass lesion/mass effect. Stable mild
atrophy and chronic microvascular ischemic changes.

Vascular: No hyperdense vessel or unexpected calcification.

Skull: Normal. Negative for fracture or focal lesion.

Sinuses/Orbits: No acute finding.

Other: None.
IMPRESSION: 1. No acute intracranial abnormality.
